# Patient Record
Sex: Female | Born: 1960 | Race: White | Hispanic: No | Marital: Married | Smoking: Never smoker
Health system: Southern US, Community
[De-identification: ages and names within clinical notes are randomized; demographics above are authoritative.]

## PROBLEM LIST (undated history)

## (undated) DIAGNOSIS — R079 Chest pain, unspecified: Secondary | ICD-10-CM

## (undated) DIAGNOSIS — F419 Anxiety disorder, unspecified: Secondary | ICD-10-CM

## (undated) DIAGNOSIS — M1990 Unspecified osteoarthritis, unspecified site: Secondary | ICD-10-CM

## (undated) DIAGNOSIS — K6289 Other specified diseases of anus and rectum: Secondary | ICD-10-CM

## (undated) DIAGNOSIS — R002 Palpitations: Secondary | ICD-10-CM

## (undated) DIAGNOSIS — T7840XA Allergy, unspecified, initial encounter: Secondary | ICD-10-CM

## (undated) DIAGNOSIS — D649 Anemia, unspecified: Secondary | ICD-10-CM

## (undated) DIAGNOSIS — K219 Gastro-esophageal reflux disease without esophagitis: Secondary | ICD-10-CM

## (undated) DIAGNOSIS — Z5189 Encounter for other specified aftercare: Secondary | ICD-10-CM

## (undated) HISTORY — DX: Encounter for other specified aftercare: Z51.89

## (undated) HISTORY — PX: TUBAL LIGATION: SHX77

## (undated) HISTORY — DX: Other specified diseases of anus and rectum: K62.89

## (undated) HISTORY — DX: Gastro-esophageal reflux disease without esophagitis: K21.9

## (undated) HISTORY — PX: TOTAL ABDOMINAL HYSTERECTOMY: SHX209

## (undated) HISTORY — DX: Anemia, unspecified: D64.9

## (undated) HISTORY — DX: Allergy, unspecified, initial encounter: T78.40XA

## (undated) HISTORY — DX: Unspecified osteoarthritis, unspecified site: M19.90

## (undated) HISTORY — DX: Chest pain, unspecified: R07.9

## (undated) HISTORY — DX: Anxiety disorder, unspecified: F41.9

## (undated) HISTORY — PX: LUMBAR DISC SURGERY: SHX700

## (undated) HISTORY — DX: Palpitations: R00.2

---

## 2001-07-10 ENCOUNTER — Other Ambulatory Visit: Admission: RE | Admit: 2001-07-10 | Discharge: 2001-07-10 | Payer: Self-pay | Admitting: *Deleted

## 2003-12-23 ENCOUNTER — Encounter: Payer: Self-pay | Admitting: Nurse Practitioner

## 2003-12-23 ENCOUNTER — Encounter: Payer: Self-pay | Admitting: Gastroenterology

## 2004-08-11 DIAGNOSIS — K625 Hemorrhage of anus and rectum: Secondary | ICD-10-CM | POA: Insufficient documentation

## 2005-07-13 ENCOUNTER — Other Ambulatory Visit: Admission: RE | Admit: 2005-07-13 | Discharge: 2005-07-13 | Payer: Self-pay | Admitting: Obstetrics and Gynecology

## 2005-11-28 ENCOUNTER — Emergency Department (HOSPITAL_COMMUNITY): Admission: EM | Admit: 2005-11-28 | Discharge: 2005-11-28 | Payer: Self-pay | Admitting: Emergency Medicine

## 2007-01-20 ENCOUNTER — Other Ambulatory Visit: Admission: RE | Admit: 2007-01-20 | Discharge: 2007-01-20 | Payer: Self-pay | Admitting: Obstetrics and Gynecology

## 2007-02-13 ENCOUNTER — Ambulatory Visit: Payer: Self-pay | Admitting: Gastroenterology

## 2007-03-22 ENCOUNTER — Ambulatory Visit: Payer: Self-pay | Admitting: Gastroenterology

## 2007-09-13 ENCOUNTER — Ambulatory Visit: Payer: Self-pay | Admitting: Gastroenterology

## 2007-09-13 LAB — CONVERTED CEMR LAB
ALT: 15 units/L (ref 0–35)
AST: 42 units/L — ABNORMAL HIGH (ref 0–37)
Albumin: 3.5 g/dL (ref 3.5–5.2)
Alkaline Phosphatase: 53 units/L (ref 39–117)
BUN: 8 mg/dL (ref 6–23)
Basophils Absolute: 0.1 10*3/uL (ref 0.0–0.1)
Basophils Relative: 0.8 % (ref 0.0–1.0)
Bilirubin Urine: NEGATIVE
CO2: 30 meq/L (ref 19–32)
Calcium: 9.1 mg/dL (ref 8.4–10.5)
Chloride: 98 meq/L (ref 96–112)
Creatinine, Ser: 0.6 mg/dL (ref 0.4–1.2)
Crystals: NEGATIVE
Eosinophils Absolute: 0.1 10*3/uL (ref 0.0–0.6)
Eosinophils Relative: 0.9 % (ref 0.0–5.0)
GFR calc Af Amer: 137 mL/min
GFR calc non Af Amer: 113 mL/min
Glucose, Bld: 146 mg/dL — ABNORMAL HIGH (ref 70–99)
HCT: 35 % — ABNORMAL LOW (ref 36.0–46.0)
Hemoglobin: 11.6 g/dL — ABNORMAL LOW (ref 12.0–15.0)
Ketones, ur: NEGATIVE mg/dL
Lymphocytes Relative: 21 % (ref 12.0–46.0)
MCHC: 33.1 g/dL (ref 30.0–36.0)
MCV: 88.1 fL (ref 78.0–100.0)
Monocytes Absolute: 0.4 10*3/uL (ref 0.2–0.7)
Monocytes Relative: 5.3 % (ref 3.0–11.0)
Mucus, UA: NEGATIVE
Neutro Abs: 6 10*3/uL (ref 1.4–7.7)
Neutrophils Relative %: 72 % (ref 43.0–77.0)
Nitrite: NEGATIVE
Platelets: 239 10*3/uL (ref 150–400)
Potassium: 3.7 meq/L (ref 3.5–5.1)
RBC: 3.98 M/uL (ref 3.87–5.11)
RDW: 13.2 % (ref 11.5–14.6)
Sodium: 135 meq/L (ref 135–145)
Specific Gravity, Urine: 1.025 (ref 1.000–1.03)
Total Bilirubin: 0.4 mg/dL (ref 0.3–1.2)
Total Protein, Urine: NEGATIVE mg/dL
Total Protein: 6.4 g/dL (ref 6.0–8.3)
Urine Glucose: NEGATIVE mg/dL
Urobilinogen, UA: 0.2 (ref 0.0–1.0)
WBC: 8.3 10*3/uL (ref 4.5–10.5)
pH: 6 (ref 5.0–8.0)

## 2007-09-22 ENCOUNTER — Ambulatory Visit: Payer: Self-pay | Admitting: Gastroenterology

## 2007-09-22 LAB — CONVERTED CEMR LAB
Ferritin: 5.3 ng/mL — ABNORMAL LOW (ref 10.0–291.0)
Folate: 7.1 ng/mL
Iron: 85 ug/dL (ref 42–145)
Saturation Ratios: 18.4 % — ABNORMAL LOW (ref 20.0–50.0)
Transferrin: 330.8 mg/dL (ref >=212.0)
Vitamin B-12: 290 pg/mL (ref 211–911)

## 2008-02-23 DIAGNOSIS — K5289 Other specified noninfective gastroenteritis and colitis: Secondary | ICD-10-CM | POA: Insufficient documentation

## 2008-02-23 DIAGNOSIS — K5120 Ulcerative (chronic) proctitis without complications: Secondary | ICD-10-CM

## 2008-02-23 DIAGNOSIS — R197 Diarrhea, unspecified: Secondary | ICD-10-CM

## 2008-02-23 DIAGNOSIS — K589 Irritable bowel syndrome without diarrhea: Secondary | ICD-10-CM | POA: Insufficient documentation

## 2009-06-13 ENCOUNTER — Ambulatory Visit (HOSPITAL_COMMUNITY): Admission: RE | Admit: 2009-06-13 | Discharge: 2009-06-14 | Payer: Self-pay | Admitting: Neurological Surgery

## 2009-07-21 ENCOUNTER — Encounter: Admission: RE | Admit: 2009-07-21 | Discharge: 2009-07-21 | Payer: Self-pay | Admitting: Neurological Surgery

## 2009-09-15 ENCOUNTER — Encounter: Admission: RE | Admit: 2009-09-15 | Discharge: 2009-09-15 | Payer: Self-pay | Admitting: Neurological Surgery

## 2010-03-06 ENCOUNTER — Encounter: Payer: Self-pay | Admitting: Gastroenterology

## 2010-03-06 ENCOUNTER — Telehealth (INDEPENDENT_AMBULATORY_CARE_PROVIDER_SITE_OTHER): Payer: Self-pay

## 2010-03-12 ENCOUNTER — Encounter: Payer: Self-pay | Admitting: Nurse Practitioner

## 2010-03-12 ENCOUNTER — Other Ambulatory Visit: Admission: RE | Admit: 2010-03-12 | Discharge: 2010-03-12 | Payer: Self-pay | Admitting: Family Medicine

## 2010-04-10 ENCOUNTER — Encounter (INDEPENDENT_AMBULATORY_CARE_PROVIDER_SITE_OTHER): Payer: Self-pay | Admitting: *Deleted

## 2010-04-15 ENCOUNTER — Ambulatory Visit: Payer: Self-pay | Admitting: Internal Medicine

## 2010-04-15 ENCOUNTER — Telehealth: Payer: Self-pay | Admitting: Gastroenterology

## 2010-04-15 ENCOUNTER — Ambulatory Visit: Payer: Self-pay | Admitting: Gastroenterology

## 2010-04-19 ENCOUNTER — Telehealth: Payer: Self-pay | Admitting: Gastroenterology

## 2010-07-22 ENCOUNTER — Emergency Department (HOSPITAL_COMMUNITY)
Admission: EM | Admit: 2010-07-22 | Discharge: 2010-07-23 | Payer: Self-pay | Source: Home / Self Care | Admitting: Emergency Medicine

## 2010-09-14 ENCOUNTER — Encounter: Admission: RE | Admit: 2010-09-14 | Discharge: 2010-09-14 | Payer: Self-pay | Admitting: Neurological Surgery

## 2010-09-28 ENCOUNTER — Encounter
Admission: RE | Admit: 2010-09-28 | Discharge: 2010-09-28 | Payer: Self-pay | Source: Home / Self Care | Attending: Neurological Surgery | Admitting: Neurological Surgery

## 2010-10-23 ENCOUNTER — Ambulatory Visit (HOSPITAL_COMMUNITY)
Admission: RE | Admit: 2010-10-23 | Discharge: 2010-10-24 | Payer: Self-pay | Source: Home / Self Care | Attending: Neurological Surgery | Admitting: Neurological Surgery

## 2010-10-26 LAB — SURGICAL PCR SCREEN
MRSA, PCR: NEGATIVE
Staphylococcus aureus: POSITIVE — AB

## 2010-10-26 LAB — DIFFERENTIAL
Basophils Absolute: 0 10*3/uL (ref 0.0–0.1)
Basophils Relative: 1 % (ref 0–1)
Eosinophils Absolute: 0.1 10*3/uL (ref 0.0–0.7)
Eosinophils Relative: 1 % (ref 0–5)
Lymphocytes Relative: 29 % (ref 12–46)
Lymphs Abs: 1.9 10*3/uL (ref 0.7–4.0)
Monocytes Absolute: 0.4 10*3/uL (ref 0.1–1.0)
Monocytes Relative: 6 % (ref 3–12)
Neutro Abs: 4.4 10*3/uL (ref 1.7–7.7)
Neutrophils Relative %: 64 % (ref 43–77)

## 2010-10-26 LAB — BASIC METABOLIC PANEL
BUN: 9 mg/dL (ref 6–23)
CO2: 26 mEq/L (ref 19–32)
Calcium: 9.4 mg/dL (ref 8.4–10.5)
Chloride: 104 mEq/L (ref 96–112)
Creatinine, Ser: 0.67 mg/dL (ref 0.4–1.2)
GFR calc Af Amer: >60 mL/min (ref >60)
GFR calc non Af Amer: >60 mL/min (ref >60)
Glucose, Bld: 90 mg/dL (ref 70–99)
Potassium: 4.8 mEq/L (ref 3.5–5.1)
Sodium: 137 mEq/L (ref 135–145)

## 2010-10-26 LAB — PROTIME-INR
INR: 1.01 (ref 0.00–1.49)
Prothrombin Time: 13.5 seconds (ref 11.6–15.2)

## 2010-10-26 LAB — CBC
HCT: 38.2 % (ref 36.0–46.0)
Hemoglobin: 12.7 g/dL (ref 12.0–15.0)
MCH: 29.9 pg (ref 26.0–34.0)
MCHC: 33.2 g/dL (ref 30.0–36.0)
MCV: 89.9 fL (ref 78.0–100.0)
Platelets: 217 10*3/uL (ref 150–400)
RBC: 4.25 MIL/uL (ref 3.87–5.11)
RDW: 14.2 % (ref 11.5–15.5)
WBC: 6.8 10*3/uL (ref 4.0–10.5)

## 2010-10-26 LAB — APTT: aPTT: 31 seconds (ref 24–37)

## 2010-11-06 NOTE — Op Note (Addendum)
NAMENGOC, DETJEN NO.:  [id number]  MEDICAL RECORD NO.:  [id number]          PATIENT TYPE:  OIB  LOCATION:  9417                         FACILITY:  [facility]  PHYSICIAN:  Eustace Moore, MD     DATE OF BIRTH:  March 24, 1961  DATE OF PROCEDURE:  10/23/2010 DATE OF DISCHARGE:                              OPERATIVE REPORT   PREOPERATIVE DIAGNOSIS:  Adjacent level cervical spondylosis, C3-4, C4-5 with neck and right arm pain.  POSTOPERATIVE DIAGNOSIS:  Adjacent level cervical spondylosis, C3-4, C4- 5 with neck and right arm pain.  PROCEDURES: 1. Cervical reexploration with removal of hardware, C5-C7. 2. Decompressive anterior cervical diskectomy, C3-4, C4-5. 3. Anterior cervical arthrodesis, C3-4, C4-5 utilizing PEEK interbody     cages packed with local autograft and Actifuse putty. 4. Anterior cervical plating, C3-C5, utilizing the Orthofix plate.  SURGEON:  Eustace Moore, MD  ASSISTANT:  Kary Kos, MD  ANESTHESIA:  General endotracheal.  COMPLICATIONS:  None apparent.  INDICATIONS FOR PROCEDURE:  Ms. Detlefsen is a 50 year old female who underwent a 2-level ACDF with plating at C5-6 and C6-7 in the past, who presented with neck and right arm pain.  She had a CT scan which showed a solid fusion at the previous levels, both spondylosis at C3-4 and C4-5 with severe neural foraminal stenosis on the right at C4-5, recommended cervical reexploration with removal of the plate and anterior cervical diskectomy and fusion plating at C3-4 and C4-5 in hopes of improving her pain syndrome.  She understood the risks, benefits, and expected outcome, and wished to proceed.  DESCRIPTION OF THE PROCEDURE:  The patient was taken to the operating room and after induction of adequate generalized endotracheal anesthesia, she was placed in supine position on the operating room table.  Her right anterior cervical region was prepped with DuraPrep and then draped in usual sterile  fashion.  A 3 mL of local anesthesia was injected and a transverse incision was made to the right of the midline and carried down to the platysma which was elevated and then undermined with Metzenbaum scissors.  I then dissected a plane medial to the sternocleidomastoid mastoid muscle and the internal carotid artery and lateral to the trachea and esophagus to expose the old plate.  I used blunt dissection to expose the plate.  I was able to remove the locking caps and then removed the screws and then removed the plate and inspected the previous fusion.  I then took down the longus colli muscles at C3-4 and C4-5.  Intraoperative fluoroscopy confirmed my level.  The annulus was incised and the initial diskectomies were done with pituitary rongeurs.  The shadow-line retractors were placed under the longus colli muscles to expose C3-4 and C4-5.  I then removed the anterior-inferior lip of C3 and C4 to further uncover the disk space.  I then used curettes to scrape the endplates to free it of disk.  I then used the high-speed drill to prepare the endplates for arthrodesis.  The drill shavings were saved in a mucous trap for later arthrodesis.  I drilled down to the level of  the posterior spurs and posterior longitudinal ligament.  The operating microscope was brought to the field.  The posterior longitudinal was opened with a black nerve hook and then removed, undercutting the bodies of C4 and C5 and C3 and C4. There were large osteophytes on the right-hand side at both levels that were removed with 1 and 2 mm Kerrison punch until the dura was free and our nerve hook passed easily circumferentially and into the foramina. The nerve hook passed easily over the C4 and C5 nerve roots.  We could see the cord pulsatile through the dura.  We felt like we had a good decompression of both levels, then we measured our interspace to be 7 mm at C4-5 and 6 mm at C3-4 and used corresponding PEEK interbody  cages packed with local autograft and Actifuse putty and tapped these into position at both levels.  I then used a 40-mm Orthofix reliant plate and placed two 14-mm variable angle screws in the bodies of C3, C4, and C5, and then locked these into position with the locking mechanism.  I then irrigated with saline solution containing bacitracin, dried all bleeding points with bipolar cautery and with Surgifoam, placed a 7 flat JP drain and once meticulous hemostasis was achieved, I closed the platysma with 3-0 Vicryl, closed the subcuticular tissue with 3-0 Vicryl, and closed the skin with Benzoin and Steri-Strips.  The drapes were removed.  The sterile dressing was applied.  The patient was awakened from general anesthesia and transferred to recovery room in stable condition.  At the end of the procedure, all sponge, needles, and instrument counts were correct.     Eustace Moore, MD     DSJ/MEDQ  D:  10/23/2010  T:  10/23/2010  Job:  [id number]  Electronically Signed by Sherley Bounds MD on 11/06/2010 08:41:10 AM

## 2010-11-10 NOTE — Progress Notes (Signed)
Summary: Triage   Phone Note Call from Patient Call back at Home Phone [phone]   Caller: Patient Call For: Dr. [name] Plan Reason for Call: Talk to Nurse Summary of Call: pt. having problems with her colitis and wants to know if something can be prescribed. Initial call taken by: Webb Laws,  April 15, 2010 11:20 AM  Follow-up for Phone Call        Patient  is scheduled for colon with Dr [name] Plan for next week.  She hasn't been on Lialda or Canasa for years.  patient repoorts a flare of her UC. She will come in today and see Tye Savoy RNP at 2:00 to be assessed.   Follow-up by: Barb Merino RN, [place],  April 15, 2010 12:04 PM

## 2010-11-10 NOTE — Letter (Signed)
Summary: Previsit letter  [facility]  [place], [place] [address]   Phone: [phone]  Fax: [phone]       03/06/2010 MRN: [id number]  [facility] [place], [place]  [address]  Dear [name],  Welcome to the Gastroenterology Division at [facility].    You are scheduled to see a nurse for your pre-procedure visit on 04/15/10  at 10:30 a.m. on the 3rd floor at [facility] [place] [employer].  We ask that you try to arrive at our office 15 minutes prior to your appointment time to allow for check-in.  Your nurse visit will consist of discussing your medical and surgical history, your immediate family medical history, and your medications.    Please bring a complete list of all your medications or, if you prefer, bring the medication bottles and we will list them.  We will need to be aware of both prescribed and over the counter drugs.  We will need to know exact dosage information as well.  If you are on blood thinners (Coumadin, Plavix, Aggrenox, Ticlid, etc.) please call our office today/prior to your appointment, as we need to consult with your physician about holding your medication.   Please be prepared to read and sign documents such as consent forms, a financial agreement, and acknowledgement forms.  If necessary, and with your consent, a friend or relative is welcome to sit-in on the nurse visit with you.  Please bring your insurance card so that we may make a copy of it.  If your insurance requires a referral to see a specialist, please bring your referral form from your primary care physician.  No co-pay is required for this nurse visit.     If you cannot keep your appointment, please call [phone] to cancel or reschedule prior to your appointment date.  This allows [place] the opportunity to schedule an appointment for another patient in need of care.    Thank you for choosing [facility] for your medical  needs.  We appreciate the opportunity to care for you.  Please visit [place] at our website  to learn more about our practice.                     Sincerely.                                                                                                                   The Gastroenterology Division  Appended Document: Previsit letter letter mailed to patient's home

## 2010-11-10 NOTE — Progress Notes (Signed)
Summary: Schedule overdue recall colon   Phone Note Outgoing Call Call back at [facility] Phone [phone]   Call placed by: Barb Merino RN, CGRN,  Mar 06, 2010 10:00 AM Call placed to: Patient Summary of Call: Placed a call to the patient to discuss scheduling overdue recall colon.  Patient  is scheduled for 04/23/10 8:30 and a pre-visit 04/15/10 10:30  Initial call taken by: Barb Merino RN, CGRN,  Mar 06, 2010 10:01 AM

## 2010-11-10 NOTE — Progress Notes (Signed)
   Phone Note Call from Patient   Caller: Patient Summary of Call: On call note at 1100. Still has rectal bleeding, unchanged. Seen in office on Wednesday with presumed proctitis flare. Restarted Azulfidine but did not start Canasa due to cost of $500. Advised to continue Azulifidine and if bleeding does not improve will try Canasa Supp samples and/or HC enemas. Initial call taken by: Ladene Artist MD Marval Regal,  April 19, 2010 12:17 PM

## 2010-11-10 NOTE — Letter (Signed)
Summary: [facility] Instructions  [facility]  [place], [place] [address]   Phone: [phone]  Fax: [phone]       ROAN SAWCHUK    07-20-1961    MRN: [id number]        Procedure Day Sudie Grumbling:  thursday 04/23/2010     Arrival Time: 7:30 am      Procedure Time: 8:30 am     Location of Procedure:                    _x _  [place] (4th Floor)                        [place]   Starting 5 days prior to your procedure Saturday 7/9 do not eat nuts, seeds, popcorn, corn, beans, peas,  salads, or any raw vegetables.  Do not take any fiber supplements (e.g. Metamucil, Citrucel, and Benefiber).  THE DAY BEFORE YOUR PROCEDURE         DATE: Wednesday 7/13  1.  Drink clear liquids the entire day-NO SOLID FOOD  2.  Do not drink anything colored red or purple.  Avoid juices with pulp.  No orange juice.  3.  Drink at least 64 oz. (8 glasses) of fluid/clear liquids during the day to prevent dehydration and help the prep work efficiently.  CLEAR LIQUIDS INCLUDE: Water Jello Ice Popsicles Tea (sugar ok, no milk/cream) Powdered fruit flavored drinks Coffee (sugar ok, no milk/cream) Gatorade Juice: apple, white grape, white cranberry  Lemonade Clear bullion, consomm, broth Carbonated beverages (any kind) Strained chicken noodle soup Hard Candy                             4.  In the morning, mix first dose of MoviPrep solution:    Empty 1 Pouch A and 1 Pouch B into the disposable container    Add lukewarm drinking water to the top line of the container. Mix to dissolve    Refrigerate (mixed solution should be used within 24 hrs)  5.  Begin drinking the prep at 5:00 p.m. The MoviPrep container is divided by 4 marks.   Every 15 minutes drink the solution down to the next mark (approximately 8 oz) until the full liter is complete.   6.  Follow completed prep with 16 oz of clear liquid of your choice (Nothing  red or purple).  Continue to drink clear liquids until bedtime.  7.  Before going to bed, mix second dose of MoviPrep solution:    Empty 1 Pouch A and 1 Pouch B into the disposable container    Add lukewarm drinking water to the top line of the container. Mix to dissolve    Refrigerate  THE DAY OF YOUR PROCEDURE      DATE: Thursday 7/14  Beginning at 3:30 am (5 hours before procedure):         1. Every 15 minutes, drink the solution down to the next mark (approx 8 oz) until the full liter is complete.  2. Follow completed prep with 16 oz. of clear liquid of your choice.    3. You may drink clear liquids until 6:30 am (2 HOURS BEFORE PROCEDURE).   MEDICATION INSTRUCTIONS  Unless otherwise instructed, you should take regular prescription medications with a small sip of water   as early as possible the morning of  your procedure.        OTHER INSTRUCTIONS  You will need a responsible adult at least 52 years of age to accompany you and drive you home.   This person must remain in the waiting room during your procedure.  Wear loose fitting clothing that is easily removed.  Leave jewelry and other valuables at home.  However, you may wish to bring a book to read or  an iPod/MP3 player to listen to music as you wait for your procedure to start.  Remove all body piercing jewelry and leave at home.  Total time from sign-in until discharge is approximately 2-3 hours.  You should go home directly after your procedure and rest.  You can resume normal activities the  day after your procedure.  The day of your procedure you should not:   Drive   Make legal decisions   Operate machinery   Drink alcohol   Return to work  You will receive specific instructions about eating, activities and medications before you leave.    The above instructions have been reviewed and explained to me by   Ulice Dash RN  April 15, 2010 11:09 AM     I fully understand and can verbalize  these instructions _____________________________ Date _________

## 2010-11-10 NOTE — Assessment & Plan Note (Signed)
Summary: pt of stark/ colitis flare/Jacqueline Castro    History of Present Illness Visit Type: Follow-up Visit Primary GI MD: [name] MD Jacqueline Castro Primary Provider: [name], MD  Requesting Provider: na Chief Complaint: Colitis flare. 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  Patient discontinued IBD meds in 2008 because she felt well. She did fine except for occasional diarrhea, rectal bleeding. Several days ago developed increasing gas, RLQ pain, urgency, and rectal bleeding. Not having a lot of diarrhea, in fact stools vary from "pellets" to loose and doesn't necessarily have BM every day. She is scheduled for a colonoscopy with Dr. [name] Plan next Thursday but pretty miserable so came in today. No fevers. Joints in her hand ache. Weight stable.   GI Review of Systems     Location of  Abdominal pain: RLQ.    Denies abdominal pain, acid reflux, belching, bloating, chest pain, dysphagia with liquids, dysphagia with solids, heartburn, loss of appetite, nausea, vomiting, vomiting blood, weight loss, and  weight gain.      Reports rectal bleeding.     Denies anal fissure, black tarry stools, change in bowel habit, constipation, diarrhea, diverticulosis, fecal incontinence, heme positive stool, hemorrhoids, irritable bowel syndrome, jaundice, light color stool, liver problems, and  rectal pain.    Current Medications (verified): 1)  Cymbalta 30 Mg Cpep (Duloxetine Hcl) .... One Tablet By Mouth Once Daily 2)  Vitamin C 500 Mg  Tabs (Ascorbic Acid) .... One Tablet By Mouth Once Daily 3)  Vitamin D 1000 Unit  Tabs (Cholecalciferol) .... One Tablet By Mouth Once Daily 4)  Fish Oil   Oil (Fish Oil) .... One Tablet By Mouth Once Daily  Allergies (verified): 1)  ! Codeine  Past History:  Past Medical History: ? 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  Alcohol Use - no Daily Caffeine Use: Coffee  Illicit Drug Use - no Smoking Status:  never Drug Use:  no  Review of Systems  The patient denies allergy/sinus, anemia, anxiety-new, arthritis/joint pain, back pain, blood in urine, breast changes/lumps, change in vision, confusion, cough, coughing up blood, depression-new, fainting, fatigue, fever, headaches-new, hearing problems, heart murmur, heart rhythm changes, itching, menstrual pain, muscle pains/cramps, night sweats, nosebleeds, pregnancy symptoms, shortness of breath, skin rash, sleeping problems, sore throat, swelling of feet/legs, swollen lymph glands, thirst - excessive , urination - excessive , urination changes/pain, urine leakage, vision changes, and voice change.    Vital Signs:  Patient profile:   50 year old female Height:      65 inches Weight:      151 pounds BMI:     25.22 BSA:     1.76 Pulse rate:   88 / minute Pulse rhythm:   regular BP sitting:   100 / 60  (left arm) Cuff size:   regular  Vitals Entered By: Hope Pigeon CMA (April 15, 2010 2:04 PM)  Physical Exam  General:  Well developed, well nourished, no acute distress. Head:  Normocephalic and atraumatic. Eyes:  Conjunctiva pink, no icterus.  Mouth:  No oral lesions. Tongue moist.  Neck:  no obvious masses  Lungs:  Clear throughout to auscultation. Heart:  Regular rate and rhythm; no murmurs, rubs,  or bruits. Abdomen:  Abdomen soft,  nondistended. Mild LLQ tenderness. No obvious masses or hepatomegaly.Normal bowel sounds.  Rectal:  No fissures or external or internal lesions appreciated. Scant amount light brown heme negative stool in vault Msk:  Symmetrical with no gross deformities.  Normal posture. Extremities:  No palmar erythema, no edema.  Neurologic:  Alert and  oriented x4;  grossly normal neurologically. Skin:  Intact without significant lesions or rashes. Cervical Nodes:  No significant cervical adenopathy. Psych:  Alert and cooperative. Normal mood and affect.   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 Restart prior medications including Sulfasalazine 1 gm two times a day and Canasa Suppositories. She understands these medications will not work right away. Will obtain the results of labs drawn last month by PCP. Today will obtain ESR.  Low residue diet for now.  Keep scheduled colonoscopy appointment.   Other Orders: TLB-Sedimentation Rate (ESR) (85652-ESR)  Patient Instructions: 1)  Please go to lab, basement level. 2)  Low residue diet brochure provided. 3)  We have sent prescriptions to [facility] for Canasa Suppositories and Sufasalazine.  4)  Copy sent to : [name] , MD 5)  The medication list was reviewed and reconciled.  All changed / newly prescribed medications were explained.  A complete medication list was provided to the patient / caregiver. Prescriptions: CANASA 1000 MG SUPP (MESALAMINE) Use 1 suppository at bedtime  #30 x 6   Entered by:   Marisue Humble NCMA   Authorized by:   Tye Savoy NP   Signed by:   Marisue Humble NCMA on 04/15/2010   Method used:   Electronically to        [id number]* (retail)       [place], [place]  [address]       Ph: [phone]       Fax: [phone]   RxID:   [id number] SULFASALAZINE 500 MG TABS (SULFASALAZINE) Take 2 tab twice daily  #120 x 6   Entered by:   Marisue Humble NCMA   Authorized by:   Tye Savoy NP   Signed by:   Marisue Humble NCMA on 04/15/2010   Method used:   Electronically to        [facility] [id number]* (retail)       [address]       [place],   [address]       Ph: [phone]       Fax: [phone]   RxID:   [id number]

## 2010-11-10 NOTE — Miscellaneous (Signed)
Summary: [facility]  Clinical Lists Changes  Medications: Added new medication of MOVIPREP 100 GM  SOLR (PEG-KCL-NACL-NASULF-NA ASC-C) As per prep instructions. - Signed Rx of MOVIPREP 100 GM  SOLR (PEG-KCL-NACL-NASULF-NA ASC-C) As per prep instructions.;  #1 x 0;  Signed;  Entered by: Ulice Dash RN;  Authorized by: Ladene Artist MD [facility];  Method used: Electronically to [facility] [id number] [address] [place], [place]  [address], Ph: [phone], Fax: [phone] Allergies: Added new allergy or adverse reaction of CODEINE Observations: Added new observation of NKA: F (04/15/2010 10:13)    Prescriptions: MOVIPREP 100 GM  SOLR (PEG-KCL-NACL-NASULF-NA ASC-C) As per prep instructions.  #1 x 0   Entered by:   Ulice Dash RN   Authorized by:   Ladene Artist MD [facility]   Signed by:   Ulice Dash RN on 04/15/2010   Method used:   Electronically to        [facility] [address]* (retail)       [place], [place]  [address]       Ph: [phone]       Fax: [phone]   RxID:   [id number]

## 2010-11-17 ENCOUNTER — Other Ambulatory Visit: Payer: Self-pay | Admitting: Neurological Surgery

## 2010-11-17 ENCOUNTER — Ambulatory Visit
Admission: RE | Admit: 2010-11-17 | Discharge: 2010-11-17 | Disposition: A | Discharge status: Home / Self Care | Payer: Managed Care, Other (non HMO) | Source: Ambulatory Visit | Attending: Neurological Surgery | Admitting: Neurological Surgery

## 2010-11-17 DIAGNOSIS — M47812 Spondylosis without myelopathy or radiculopathy, cervical region: Secondary | ICD-10-CM

## 2010-11-17 DIAGNOSIS — M542 Cervicalgia: Secondary | ICD-10-CM

## 2010-11-17 DIAGNOSIS — M79609 Pain in unspecified limb: Secondary | ICD-10-CM

## 2011-01-16 LAB — PROTIME-INR
INR: 1.1 (ref 0.00–1.49)
Prothrombin Time: 13.7 seconds (ref 11.6–15.2)

## 2011-01-16 LAB — BASIC METABOLIC PANEL
BUN: 13 mg/dL (ref 6–23)
CO2: 28 mEq/L (ref 19–32)
Calcium: 9.1 mg/dL (ref 8.4–10.5)
Chloride: 105 mEq/L (ref 96–112)
Creatinine, Ser: 0.75 mg/dL (ref 0.4–1.2)
GFR calc Af Amer: >60 mL/min (ref >60)
GFR calc non Af Amer: >60 mL/min (ref >60)
Glucose, Bld: 86 mg/dL (ref 70–99)
Potassium: 4.2 mEq/L (ref 3.5–5.1)
Sodium: 139 mEq/L (ref 135–145)

## 2011-01-16 LAB — CBC
HCT: 37.3 % (ref 36.0–46.0)
Hemoglobin: 12.6 g/dL (ref 12.0–15.0)
MCHC: 33.7 g/dL (ref 30.0–36.0)
MCV: 93.3 fL (ref 78.0–100.0)
Platelets: 212 10*3/uL (ref 150–400)
RBC: 4 MIL/uL (ref 3.87–5.11)
RDW: 14.1 % (ref 11.5–15.5)
WBC: 7 10*3/uL (ref 4.0–10.5)

## 2011-01-16 LAB — APTT: aPTT: 26 seconds (ref 24–37)

## 2011-01-16 LAB — DIFFERENTIAL
Basophils Absolute: 0 10*3/uL (ref 0.0–0.1)
Basophils Relative: 0 % (ref 0–1)
Eosinophils Absolute: 0.1 10*3/uL (ref 0.0–0.7)
Eosinophils Relative: 1 % (ref 0–5)
Lymphocytes Relative: 28 % (ref 12–46)
Lymphs Abs: 2 10*3/uL (ref 0.7–4.0)
Monocytes Absolute: 0.4 10*3/uL (ref 0.1–1.0)
Monocytes Relative: 5 % (ref 3–12)
Neutro Abs: 4.6 10*3/uL (ref 1.7–7.7)
Neutrophils Relative %: 65 % (ref 43–77)

## 2011-01-19 ENCOUNTER — Ambulatory Visit
Admission: RE | Admit: 2011-01-19 | Discharge: 2011-01-19 | Disposition: A | Discharge status: Home / Self Care | Payer: Managed Care, Other (non HMO) | Source: Ambulatory Visit | Attending: Neurological Surgery | Admitting: Neurological Surgery

## 2011-01-19 ENCOUNTER — Other Ambulatory Visit: Payer: Self-pay | Admitting: Neurological Surgery

## 2011-01-19 DIAGNOSIS — M79609 Pain in unspecified limb: Secondary | ICD-10-CM

## 2011-01-19 DIAGNOSIS — M47812 Spondylosis without myelopathy or radiculopathy, cervical region: Secondary | ICD-10-CM

## 2011-01-19 DIAGNOSIS — M542 Cervicalgia: Secondary | ICD-10-CM

## 2011-02-23 NOTE — Assessment & Plan Note (Signed)
[facility] OFFICE NOTE   MATEJA, DIER                        MRN:          [id number]  DATE:03/22/2007                            DOB:          [birth year]    This is a return office visit for ulcerative proctitis.  Her symptoms  have completely resolved after a short course of Canasa suppositories  and Lialda.  She completed suppositories and remains on Lialda.  She has  no gastrointestinal complaints and specifically denies any rectal  bleeding, diarrhea, mucus per rectum or abdominal pain.   CURRENT MEDICATIONS:  1. Cymbalta 60 mg daily.  2. Lialda 2.4 g daily.  3. Ibuprofen p.r.n.   MEDICATION ALLERGIES:  CODEINE.   EXAM:  No acute distress, weight 149 pounds, blood pressure is 94/68,  pulse 84 and regular.  She was not re-examined.   ASSESSMENT AND PLAN:  Ulcerative proctitis.  She requests generic  medication for cost reasons.  Trial of sulfasalazine 1 g b.i.d. and if  she tolerates this, she can remain on this long-term for maintenance  therapy.  I would like to see her for return office visit at least every  year and sooner, if she has recurrent symptoms.  If she does not  tolerate the sulfasalazine, she is to notify our office and we will try  another 5ASA agent.     Pricilla Riffle. [name] Plan, MD, [facility]  Electronically Signed    MTS/MedQ  DD: 03/22/2007  DT: 03/22/2007  Job #: [id number]

## 2011-02-26 NOTE — Assessment & Plan Note (Signed)
[facility]                         GASTROENTEROLOGY OFFICE NOTE   Jacqueline Castro                        MRN:          [id number]  DATE:02/13/2007                            DOB:          1960/11/24    CHIEF COMPLAINT:  A 50 year old, white female who returns for followup  of rectal bleeding and ulcerative proctitis.   HISTORY OF PRESENT ILLNESS:  Jacqueline Castro was diagnosed with colitis at  age 12. I first saw her in November 2005 for rectal bleeding associated  with alternating diarrhea and constipation. She underwent a colonoscopy  in March 2005 which showed proctitis. She was also felt to have  irritable bowel syndrome. She was treated with Canasa suppositories and  Pamine Forte and her symptoms resolved. She has not returned for  followup since April 2005. She states she has had episodic flares of  rectal bleeding associated with mucous and diarrhea. She no longer has  any constipation but she does have intermittent diarrhea including  occasional urgent stools. Over the past several months, her symptoms  have been quite active with daily rectal bleeding and mucous per rectum.  She notes no abdominal pain, nausea or vomiting. She has lost about 10  pounds intentionally on a weight loss program. Her mother has colon  polyps, no other family members with colon polyps, colon cancer or  inflammatory bowel disease.   PAST MEDICAL HISTORY:  Anxiety, status post bilateral tubal ligation,  psoriasis, ulcerative proctitis.   CURRENT MEDICATIONS:  1. Cymbalta 60 mg daily.  2. Ibuprofen p.r.n.   MEDICATION ALLERGIES:  CODEINE.   SOCIAL HISTORY:  As per the handwritten forms.   REVIEW OF SYSTEMS:  As per the handwritten forms.   PHYSICAL EXAMINATION:  GENERAL:  Well-developed, well-nourished, white  female in no acute distress.  VITAL SIGNS:  Height 5 feet 7 inches, weight 153 pounds. Blood pressure  is 96/64, pulse 72 and regular.  HEENT:   Anicteric sclera. Oropharynx clear.  CHEST:  Clear to auscultation bilaterally.  CARDIAC:  Regular rate and rhythm without murmurs appreciated.  ABDOMEN:  Soft, nontender, nondistended, normal active bowel sounds, no  palpable organomegaly, masses or hernias.  EXTREMITIES:  Without clubbing, cyanosis or edema.  NEUROLOGIC:  Alert and oriented x3. Grossly nonfocal.   ASSESSMENT/PLAN:  Recurrent ulcerative proctitis. I discussed the long-  term need for closer followup and possible need for long-term 5 ASA  usage. Begin Lialda 2.4 grams q.a.m. and begin Canasa 1000 mg  suppositories q.h.s.  Return office visit in 4-6 weeks. Plan for  colonoscopy in the near future to reassess her disease and perform  interval surveillance.     Jacqueline Castro Plan, MD, [facility]  Electronically Signed    MTS/MedQ  DD: 02/13/2007  DT: 02/13/2007  Job #: [id number]

## 2011-03-24 ENCOUNTER — Other Ambulatory Visit: Payer: Self-pay | Admitting: Dermatology

## 2011-04-16 ENCOUNTER — Ambulatory Visit (HOSPITAL_BASED_OUTPATIENT_CLINIC_OR_DEPARTMENT_OTHER)
Admission: RE | Admit: 2011-04-16 | Discharge: 2011-04-16 | Disposition: A | Discharge status: Home / Self Care | Payer: Managed Care, Other (non HMO) | Source: Ambulatory Visit | Attending: Orthopedic Surgery | Admitting: Orthopedic Surgery

## 2011-04-16 ENCOUNTER — Other Ambulatory Visit: Payer: Self-pay | Admitting: Orthopedic Surgery

## 2011-04-16 DIAGNOSIS — Z01812 Encounter for preprocedural laboratory examination: Secondary | ICD-10-CM | POA: Insufficient documentation

## 2011-04-16 DIAGNOSIS — L819 Disorder of pigmentation, unspecified: Secondary | ICD-10-CM | POA: Insufficient documentation

## 2011-04-18 LAB — WOUND CULTURE

## 2011-04-19 ENCOUNTER — Ambulatory Visit
Admission: RE | Admit: 2011-04-19 | Discharge: 2011-04-19 | Disposition: A | Discharge status: Home / Self Care | Payer: Managed Care, Other (non HMO) | Source: Ambulatory Visit | Attending: Neurological Surgery | Admitting: Neurological Surgery

## 2011-04-19 ENCOUNTER — Other Ambulatory Visit: Payer: Self-pay | Admitting: Neurological Surgery

## 2011-04-19 DIAGNOSIS — M542 Cervicalgia: Secondary | ICD-10-CM

## 2011-04-19 DIAGNOSIS — M47812 Spondylosis without myelopathy or radiculopathy, cervical region: Secondary | ICD-10-CM

## 2011-04-19 DIAGNOSIS — M79609 Pain in unspecified limb: Secondary | ICD-10-CM

## 2011-04-21 LAB — ANAEROBIC CULTURE: Gram Stain: NONE SEEN

## 2011-04-30 NOTE — Op Note (Signed)
  NAMEMORGEN, LINEBAUGH NO.:  [id number]  MEDICAL RECORD NO.:  [id number]  LOCATION:                                 FACILITY:  PHYSICIAN:  Daryll Brod, M.D.       DATE OF BIRTH:  [birth year]  DATE OF PROCEDURE:  04/16/2011 DATE OF DISCHARGE:                              OPERATIVE REPORT   PREOPERATIVE DIAGNOSIS:  Pigmented lesion, left ring finger nail bed.  POSTOPERATIVE DIAGNOSIS:  Pigmented lesion, left ring finger nail bed.  OPERATION:  Biopsy, left ring finger nail bed.  SURGEON:  Daryll Brod, MD  ASSISTANT:  None.  ANESTHESIA:  Forearm-based IV regional with local infiltration, metacarpal block.  ANESTHESIOLOGIST:  Jessy Oto. Frederick, MD  HISTORY:  The patient is a 50 year old female with a history of a pigmented stripe on her left ring finger nail bed.  This progressed all the way to the tip.  She has been advised to have this biopsied by her dermatologist.  Pre, peri, and postoperative course have been discussed along with risks and complications.  She is aware that there is no guarantee with the surgery, possibility of infection, recurrence of injury to arteries, nerves, and tendons, deformity to the nail plate, and regrowth.  In the preoperative area, the patient is seen.  The extremity was marked by both the patient and surgeon.  PROCEDURE:  The patient was brought to the operating room where a forearm-based IV regional anesthetic was carried out without difficulty. She was prepped using ChloraPrep, supine position, left arm free.  A 3- minute dry time was allowed.  A time-out was taken confirming the patient and procedure.  The nail plate was removed.  The pigmented area had been marked proximally and distally.  On removal of the nail plate, the pigmented area was noted to be at the proximal margin and multiple were noted.  The largest one with the greater stripe was then biopsied with an elliptical incision.  Cultures were taken prior to  the biopsy for aerobic, anaerobic, fungal, and AFB cultures.  A portion of the biopsy specimen was cut and sent also.  The nail plate was sent.  A portion was sent for pathological inspection under the microscope.  The wound was irrigated.  The nail matrix was then closed with interrupted 6- 0 chromic sutures.  A nonadherent gauze was placed between the dorsal palmar nail folds.  A sterile compressive dressing and splint were applied.  This was done after irrigation.  The patient tolerated the procedure well and was taken to the recovery room for observation in satisfactory condition.  She will be discharged home to return the [place] in 1 week on [place] NX.          ______________________________ Daryll Brod, M.D.     GK/MEDQ  D:  04/16/2011  T:  04/17/2011  Job:  [id number]  Electronically Signed by Daryll Brod M.D. on 04/30/2011 09:19:26 AM

## 2011-05-20 LAB — CULTURE, FUNGUS WITHOUT SMEAR

## 2011-08-06 ENCOUNTER — Telehealth: Payer: Self-pay | Admitting: Gastroenterology

## 2011-08-06 NOTE — Telephone Encounter (Signed)
Left message for patient to call back  

## 2011-08-09 MED ORDER — SULFASALAZINE 500 MG PO TABS: 500.0000 mg | ORAL_TABLET | Freq: Two times a day (BID) | ORAL | Status: DC

## 2011-08-09 NOTE — Telephone Encounter (Signed)
Can resume sulfasalazine 539m po bid until office appt with AE.  She has been not compliant with recommended medical follow up

## 2011-08-09 NOTE — Telephone Encounter (Signed)
Patient is having a large amount of bleeding and mucus daily she is passing blood and mucus several times a day, even with urination.  She does report one diarrhea stool a day.  Patient reports in the past she has taken sulfasalazine, for Ulcerative proctitis.  She did not keep follow up appts last year as suggested by Tye Savoy RNP on  04/15/2010, she also stopped sulfasalazine because her symptoms had improved.  She is scheduled to come in and see Nicoletta Ba PA on 08/11/11 when Dr [name] Plan is supervising I have canceled the appt with Dr [name] Plan for 08/16/11

## 2011-08-09 NOTE — Telephone Encounter (Signed)
Patient advised.

## 2011-08-11 ENCOUNTER — Ambulatory Visit (INDEPENDENT_AMBULATORY_CARE_PROVIDER_SITE_OTHER): Payer: Managed Care, Other (non HMO) | Admitting: Physician Assistant

## 2011-08-11 ENCOUNTER — Encounter: Payer: Self-pay | Admitting: Physician Assistant

## 2011-08-11 ENCOUNTER — Other Ambulatory Visit (INDEPENDENT_AMBULATORY_CARE_PROVIDER_SITE_OTHER): Payer: Managed Care, Other (non HMO)

## 2011-08-11 VITALS — BP 98/60 | HR 84 | Ht 65.5 in | Wt 147.8 lb

## 2011-08-11 DIAGNOSIS — K5120 Ulcerative (chronic) proctitis without complications: Secondary | ICD-10-CM

## 2011-08-11 DIAGNOSIS — K625 Hemorrhage of anus and rectum: Secondary | ICD-10-CM

## 2011-08-11 LAB — CBC WITH DIFFERENTIAL/PLATELET
Eosinophils Relative: 1.2 % (ref 0.0–5.0)
HCT: 35.8 % — ABNORMAL LOW (ref 36.0–46.0)
Hemoglobin: 12 g/dL (ref 12.0–15.0)
Lymphs Abs: 1.4 10*3/uL (ref 0.7–4.0)
MCV: 89.4 fl (ref 78.0–100.0)
Monocytes Absolute: 0.5 10*3/uL (ref 0.1–1.0)
Neutro Abs: 3.3 10*3/uL (ref 1.4–7.7)
Platelets: 235 10*3/uL (ref 150.0–400.0)
WBC: 5.3 10*3/uL (ref 4.5–10.5)

## 2011-08-11 MED ORDER — HYDROCORTISONE 100 MG/60ML RE ENEM: ENEMA | RECTAL | Status: DC

## 2011-08-11 MED ORDER — GLYCOPYRROLATE 2 MG PO TABS: ORAL_TABLET | ORAL | Status: DC

## 2011-08-11 MED ORDER — PEG-KCL-NACL-NASULF-NA ASC-C 100 G PO SOLR: ORAL | Status: DC

## 2011-08-11 NOTE — Progress Notes (Signed)
Reviewed and agree with management. Pricilla Riffle. [name] Plan MD Marval Regal

## 2011-08-11 NOTE — Progress Notes (Signed)
Subjective:    Patient ID: Jacqueline Castro, female    DOB: [birth year], 50 y.o.   MRN: [id number]  HPI [name] is a 50 year old white female known to Dr. [name] Plan with history of ulcerative proctitis. She has not been seen in the past couple of years and last had colonoscopy in 2005. At that time she was found to have probable proctitis. Biopsies did show chronic active proctitis. She has been treated with Azulfidine in the past but has not taken it chronically. She comes in today stating that she's been having a flareup of her symptoms over the past 3 months or so and has had intermittent mild flareups over the past couple of years but nothing to this extent. She relates lower abdominal discomfort and cramping as well as some urgency and frequency of stooling. She says she usually has about one bowel movement a day and over the past couple of months has been having a 2-3 bowel movements per day usually containing some blood and mucus. The stools are often urgent as well. Her appetite has been  fair ,she's been trying to lose some weigh,t has had some mild occasional nausea. She's not been on any recent antibiotics or any different medications that she has had an increase in her stress level recently. When she called she was started back on Azulfidine 500 mg twice daily which she's been taking over the past few days    Review of Systems  Constitutional: Negative.   HENT: Negative.   Eyes: Negative.   Respiratory: Negative.   Cardiovascular: Negative.   Gastrointestinal: Positive for abdominal pain and blood in stool.  Genitourinary: Negative.   Musculoskeletal: Negative.   Skin: Negative.   Neurological: Negative.   Hematological: Negative.   Psychiatric/Behavioral: The patient is nervous/anxious.    Outpatient Prescriptions Prior to Visit  Medication Sig Dispense Refill  . sulfaSALAzine (AZULFIDINE) 500 MG tablet Take 1 tablet (500 mg total) by mouth 2 (two) times daily.  60 tablet  0  . vitamin C  (ASCORBIC ACID) 500 MG tablet Take 500 mg by mouth daily.        . cholecalciferol (VITAMIN D) 1000 UNITS tablet Take 1,000 Units by mouth daily.        . DULoxetine (CYMBALTA) 30 MG capsule Take 30 mg by mouth daily.        . fish oil-omega-3 fatty acids 1000 MG capsule Take 2 g by mouth daily.        . mesalamine (CANASA) 1000 MG suppository Place 1,000 mg rectally at bedtime.             Allergies  Allergen Reactions  . Codeine     REACTION: vomiting   Patient Active Problem List  Diagnoses  . ULCERATIVE PROCTITIS  . IRRITABLE BOWEL SYNDROME  . RECTAL BLEEDING  . DIARRHEA, ACUTE, CHRONIC  . Ulcerative proctitis, chronic    Objective:   Physical Exam Well-developed white female in no acute distress, pleasant, blood pressure 98/60, pulse 84, HEENT; EOMI ,PERRLA s,clera anicteric,Neck; Supple no JVD, Cardiovascular; regular rate and rhythm with S1-S2 no murmur or gallop, Pulmonary; clear bilaterally, Abdomen; soft mildly tender  across the lower abdomen, no guarding, no rebound, no palpable masses or hepatosplenomegaly Rectal ;not done, Extremities; no clubbing, cyanosis or edema skin warm dry, Psych; mood and affect normal and appropriate.        Assessment & Plan:  #61  50 year old female with history of ulcerative proctitis with exacerbation x3 months presenting  with lower common cramping increased frequency of stooling urgency and rectal bleeding all consistent with acute flare of ulcerative proctitis/colitis.  Plan; increase Azulfidine to 500 mg 2 by mouth twice daily Add enemas twice daily x2-3 weeks Check CBC today Add Robinul Forte 2 mg by mouth once or twice daily as needed for cramping Schedule for colonoscopy with Dr. [name] Plan to reassess extent of her disease. Procedure discussed in detail with the patient and she is agreeable to proceed.

## 2011-08-11 NOTE — Patient Instructions (Addendum)
Please go to the basement level to have your labs drawn.  We sent a prescription for the colonoscopy prep to your pharmacy and have given  You a rebate coupon. [facility] [address]  Colonoscopy instructions given today. Scheduled with Dr. [name] Plan on 09-06-2011.

## 2011-08-12 ENCOUNTER — Telehealth: Payer: Self-pay | Admitting: *Deleted

## 2011-08-12 NOTE — Telephone Encounter (Signed)
Message copied by Hulan Saas on Thu Aug 12, 2011  9:14 AM ------      Message from: [place], [place] S      Created: Thu Aug 12, 2011  8:46 AM       Please let Jacqueline Castro know her blood counts look good-hgb 12- so she is not anemic

## 2011-08-12 NOTE — Telephone Encounter (Signed)
Spoke with patient and gave her the lab results as per Nicoletta Ba, PA

## 2011-08-12 NOTE — Telephone Encounter (Signed)
Left a message for patient to call me. 

## 2011-08-16 ENCOUNTER — Ambulatory Visit: Payer: Managed Care, Other (non HMO) | Admitting: Gastroenterology

## 2011-08-27 ENCOUNTER — Other Ambulatory Visit: Payer: Self-pay | Admitting: Gastroenterology

## 2011-09-06 ENCOUNTER — Other Ambulatory Visit: Payer: Managed Care, Other (non HMO) | Admitting: Gastroenterology

## 2011-09-14 ENCOUNTER — Ambulatory Visit (AMBULATORY_SURGERY_CENTER): Payer: Managed Care, Other (non HMO) | Admitting: Gastroenterology

## 2011-09-14 ENCOUNTER — Encounter: Payer: Self-pay | Admitting: Gastroenterology

## 2011-09-14 DIAGNOSIS — K5190 Ulcerative colitis, unspecified, without complications: Secondary | ICD-10-CM

## 2011-09-14 DIAGNOSIS — K625 Hemorrhage of anus and rectum: Secondary | ICD-10-CM

## 2011-09-14 DIAGNOSIS — K5120 Ulcerative (chronic) proctitis without complications: Secondary | ICD-10-CM

## 2011-09-14 MED ORDER — MESALAMINE 1000 MG RE SUPP: 1000.0000 mg | Freq: Two times a day (BID) | RECTAL | Status: DC

## 2011-09-14 MED ORDER — SODIUM CHLORIDE 0.9 % IV SOLN: 500.0000 mL | INTRAVENOUS | Status: DC

## 2011-09-14 NOTE — Patient Instructions (Signed)
Please refer to your blue and neon green sheets for instructions regarding diet and activity for the rest of today.  You may resume your medications as you would normally take them.   Please call and schedule an appointment to see Dr. [name] Plan in 4-6 weeks.

## 2011-09-14 NOTE — Progress Notes (Signed)
Patient did not experience any of the following events: a burn prior to discharge; a fall within the facility; wrong site/side/patient/procedure/implant event; or a hospital transfer or hospital admission upon discharge from the facility. (G8907) Patient did not have preoperative order for IV antibiotic SSI prophylaxis. (G8918)  

## 2011-09-15 ENCOUNTER — Telehealth: Payer: Self-pay | Admitting: *Deleted

## 2011-09-15 NOTE — Telephone Encounter (Signed)

## 2011-09-20 ENCOUNTER — Encounter: Payer: Self-pay | Admitting: Gastroenterology

## 2011-10-03 ENCOUNTER — Other Ambulatory Visit: Payer: Self-pay | Admitting: Gastroenterology

## 2011-10-18 ENCOUNTER — Ambulatory Visit: Payer: Managed Care, Other (non HMO) | Admitting: Gastroenterology

## 2012-08-01 ENCOUNTER — Other Ambulatory Visit: Payer: Self-pay | Admitting: Orthopedic Surgery

## 2012-08-01 DIAGNOSIS — M25511 Pain in right shoulder: Secondary | ICD-10-CM

## 2012-08-02 ENCOUNTER — Ambulatory Visit
Admission: RE | Admit: 2012-08-02 | Discharge: 2012-08-02 | Disposition: A | Discharge status: Home / Self Care | Payer: Managed Care, Other (non HMO) | Source: Ambulatory Visit | Attending: Orthopedic Surgery | Admitting: Orthopedic Surgery

## 2012-08-02 ENCOUNTER — Other Ambulatory Visit: Payer: Managed Care, Other (non HMO)

## 2012-08-02 DIAGNOSIS — M25511 Pain in right shoulder: Secondary | ICD-10-CM

## 2014-04-10 ENCOUNTER — Ambulatory Visit (INDEPENDENT_AMBULATORY_CARE_PROVIDER_SITE_OTHER): Payer: Managed Care, Other (non HMO) | Admitting: Cardiovascular Disease

## 2014-04-10 ENCOUNTER — Encounter: Payer: Self-pay | Admitting: Cardiovascular Disease

## 2014-04-10 VITALS — BP 118/74 | HR 74 | Ht 65.0 in | Wt 137.9 lb

## 2014-04-10 DIAGNOSIS — R002 Palpitations: Secondary | ICD-10-CM

## 2014-04-10 DIAGNOSIS — R0602 Shortness of breath: Secondary | ICD-10-CM

## 2014-04-10 DIAGNOSIS — R079 Chest pain, unspecified: Secondary | ICD-10-CM

## 2014-04-10 NOTE — Assessment & Plan Note (Addendum)
53 year old female referred by Dr. [name] for recent onset tachypalpitations associated with chest fullness, shortness of breath and nausea. She has no cardiac risk factors. First episode occurred about a week and a half ago when visiting her mother. She developed sudden onset of tachycardia palpitations associated with shortness of breath, chest fullness and nausea. The episode lasted approximately 5 minutes. She was evaluated in a local emergency room and was discharged without a diagnosis. She had a recurrent episode approximately 2 days ago. She does exercise routinely on the elliptical and is weightbearing without chest pain or shortness of breath. Dr. Tamala Julian recently noted blood work which I will obtain copies of. Hopefully thyroid function tests were obtained. I am going to get a 2-D echocardiogram as well as a one-month event monitor and we'll see her back in followup

## 2014-04-10 NOTE — Progress Notes (Signed)
04/10/2014 Jacqueline Castro   1961/05/28  [id number]  Primary Physician Reginia Naas, MD Primary Cardiologist: Lorretta Harp MD Renae Gloss   HPI:  Ms. Hauter is a delightful 53 year old married Caucasian female mother of 4 children, grandmother and 2 grandchildren he works for [employer]. She was by Dr. [name] for cardiovascular evaluation because of recent onset tachypalpitations. Ms. Asmus has no cardiac risk factors. She does have anxiety and a history of ulcerative colitis. She had an episode of tachycardia palpitations associated with chest tightness, shortness of breath nausea 1-1/2 weeks ago while visiting her mother who is ill. 3 days ago she had a recurrent episode. Prior to this she said no similar episodes. She is very active and exercises routinely without symptoms.   Current Outpatient Prescriptions  Medication Sig Dispense Refill  . DULoxetine (CYMBALTA) 30 MG capsule Take 30 mg by mouth daily.       No current facility-administered medications for this visit.    Allergies  Allergen Reactions  . Codeine     REACTION: vomiting    History   Social History  . Marital Status: Married    Spouse Name: N/A    Number of Children: 4  . Years of Education: N/A   Occupational History  . [employer]   .  [employer]   Social History Main Topics  . Smoking status: Never Smoker   . Smokeless tobacco: Never Used  . Alcohol Use: No     Comment: occasionally  . Drug Use: No  . Sexual Activity: Not on file   Other Topics Concern  . Not on file   Social History Narrative  . No narrative on file     Review of Systems: General: negative for chills, fever, night sweats or weight changes.  Cardiovascular: negative for chest pain, dyspnea on exertion, edema, orthopnea, palpitations, paroxysmal nocturnal dyspnea or shortness of breath Dermatological: negative for rash Respiratory: negative for cough or wheezing Urologic: negative for  hematuria Abdominal: negative for nausea, vomiting, diarrhea, bright red blood per rectum, melena, or hematemesis Neurologic: negative for visual changes, syncope, or dizziness All other systems reviewed and are otherwise negative except as noted above.    Blood pressure 118/74, pulse 74, height 5' 5"  (1.651 m), weight 137 lb 14.4 oz (62.551 kg).  General appearance: alert and no distress Neck: no adenopathy, no carotid bruit, no JVD, supple, symmetrical, trachea midline and thyroid not enlarged, symmetric, no tenderness/mass/nodules Lungs: clear to auscultation bilaterally Heart: regular rate and rhythm, S1, S2 normal, no murmur, click, rub or gallop Extremities: extremities normal, atraumatic, no cyanosis or edema  EKG normal sinus rhythm at 74 without ST or T wave changes  ASSESSMENT AND PLAN:   Palpitations 53 year old female referred by Dr. [name] for recent onset tachypalpitations associated with chest fullness, shortness of breath and nausea. She has no cardiac risk factors. First episode occurred about a week and a half ago when visiting her mother. She developed sudden onset of tachycardia palpitations associated with shortness of breath, chest fullness and nausea. The episode lasted approximately 5 minutes. She was evaluated in a local emergency room and was discharged without a diagnosis. She had a recurrent episode approximately 2 days ago. She does exercise routinely on the elliptical and is weightbearing without chest pain or shortness of breath. Dr. Tamala Julian recently noted blood work which I will obtain copies of. Hopefully thyroid function tests were obtained. I am going to get a 2-D echocardiogram as  well as a one-month event monitor and we'll see her back in followup      Lorretta Harp MD [facility], [facility] 04/10/2014 1:53 PM

## 2014-04-10 NOTE — Patient Instructions (Signed)
  We will see you back in follow up after the tests.   Dr Gwenlyn Found has ordered : 1.  Echocardiogram. Echocardiography is a painless test that uses sound waves to create images of your heart. It provides your doctor with information about the size and shape of your heart and how well your heart's chambers and valves are working. This procedure takes approximately one hour. There are no restrictions for this procedure.   2.  Event monitor. Event monitors are medical devices that record the heart's electrical activity. Doctors most often [place] these monitors to diagnose arrhythmias. Arrhythmias are problems with the speed or rhythm of the heartbeat. The monitor is a small, portable device. You can wear one while you do your normal daily activities. This is usually used to diagnose what is causing palpitations/syncope (passing out).

## 2014-04-17 ENCOUNTER — Ambulatory Visit (HOSPITAL_COMMUNITY)
Admission: RE | Admit: 2014-04-17 | Discharge: 2014-04-17 | Disposition: A | Discharge status: Home / Self Care | Payer: Managed Care, Other (non HMO) | Source: Ambulatory Visit | Attending: Cardiology | Admitting: Cardiology

## 2014-04-17 DIAGNOSIS — I059 Rheumatic mitral valve disease, unspecified: Secondary | ICD-10-CM

## 2014-04-17 DIAGNOSIS — R0602 Shortness of breath: Secondary | ICD-10-CM | POA: Insufficient documentation

## 2014-04-17 DIAGNOSIS — R002 Palpitations: Secondary | ICD-10-CM | POA: Insufficient documentation

## 2014-04-17 NOTE — Progress Notes (Signed)
2D Echo Performed 04/17/2014    Marygrace Drought, RCS

## 2014-04-19 ENCOUNTER — Telehealth: Payer: Self-pay | Admitting: Cardiovascular Disease

## 2014-04-19 NOTE — Telephone Encounter (Signed)
Pt said she had an echo on Wednesday,she wants to know if the results are ready?

## 2014-04-19 NOTE — Telephone Encounter (Signed)
Notified patient that once the results are reviewed by Dr.Berry he will give recommendations to his nurse and she will either call her or send her  A letter. Patient voiced understanding.

## 2014-05-07 ENCOUNTER — Ambulatory Visit: Payer: Managed Care, Other (non HMO) | Admitting: Cardiovascular Disease

## 2014-05-20 ENCOUNTER — Ambulatory Visit (INDEPENDENT_AMBULATORY_CARE_PROVIDER_SITE_OTHER): Payer: Managed Care, Other (non HMO) | Admitting: Cardiovascular Disease

## 2014-05-20 ENCOUNTER — Encounter: Payer: Self-pay | Admitting: Cardiovascular Disease

## 2014-05-20 VITALS — BP 136/85 | HR 65 | Ht 65.0 in | Wt 143.0 lb

## 2014-05-20 DIAGNOSIS — R002 Palpitations: Secondary | ICD-10-CM

## 2014-05-20 NOTE — Assessment & Plan Note (Signed)
The patient has had several episodes of palpitations since I saw her 6 months ago. A 2-D echocardiogram revealed normal LV systolic function. The monitor showed sinus rhythm with PACs and sinus tachycardia. She does admit to being under a lot of stress. I have suggested that she cut out caffeine intake. She'll see a mid-level provider back in 3 months and me back in 6 months.

## 2014-05-20 NOTE — Progress Notes (Signed)
     05/20/2014 Jacqueline Castro   December 06, 1960  [id number]  Primary Physician Reginia Naas, MD Primary Cardiologist: Lorretta Harp MD Renae Gloss   HPI:  Jacqueline Castro is a delightful 53 year old married Caucasian female mother of 4 children, grandmother and 2 grandchildren he works for [employer]. She was by Dr. [name] for cardiovascular evaluation because of recent onset tachypalpitations. Jacqueline Castro has no cardiac risk factors. She does have anxiety and a history of ulcerative colitis. She had an episode of tachycardia palpitations associated with chest tightness, shortness of breath nausea 1-1/2 weeks ago while visiting her mother who is ill. 3 days ago she had a recurrent episode. Prior to this she said no similar episodes. She is very active and exercises routinely without symptoms. I saw her in the office 6 weeks ago. I performed 2-D echocardiography revealing normal LV systolic function as well as an event monitor that showed sinus rhythm with PACs and sinus tachycardia.    Current Outpatient Prescriptions  Medication Sig Dispense Refill  . ALPRAZolam (XANAX) 0.5 MG tablet Take 0.5 mg by mouth as needed.       . DULoxetine (CYMBALTA) 30 MG capsule Take 30 mg by mouth daily.       No current facility-administered medications for this visit.    Allergies  Allergen Reactions  . Codeine     REACTION: vomiting    History   Social History  . Marital Status: Married    Spouse Name: N/A    Number of Children: 4  . Years of Education: N/A   Occupational History  . [employer]   .  [employer]   Social History Main Topics  . Smoking status: Never Smoker   . Smokeless tobacco: Never Used  . Alcohol Use: No     Comment: occasionally  . Drug Use: No  . Sexual Activity: Not on file   Other Topics Concern  . Not on file   Social History Narrative  . No narrative on file     Review of Systems: General: negative for chills, fever, night  sweats or weight changes.  Cardiovascular: negative for chest pain, dyspnea on exertion, edema, orthopnea, palpitations, paroxysmal nocturnal dyspnea or shortness of breath Dermatological: negative for rash Respiratory: negative for cough or wheezing Urologic: negative for hematuria Abdominal: negative for nausea, vomiting, diarrhea, bright red blood per rectum, melena, or hematemesis Neurologic: negative for visual changes, syncope, or dizziness All other systems reviewed and are otherwise negative except as noted above.    Blood pressure 136/85, pulse 65, height 5' 5"  (1.651 m), weight 143 lb (64.864 kg).  General appearance: alert and no distress Neck: no adenopathy, no carotid bruit, no JVD, supple, symmetrical, trachea midline and thyroid not enlarged, symmetric, no tenderness/mass/nodules Lungs: clear to auscultation bilaterally Heart: regular rate and rhythm, S1, S2 normal, no murmur, click, rub or gallop Extremities: extremities normal, atraumatic, no cyanosis or edema  EKG not performed today  ASSESSMENT AND PLAN:   Palpitations The patient has had several episodes of palpitations since I saw her 6 months ago. A 2-D echocardiogram revealed normal LV systolic function. The monitor showed sinus rhythm with PACs and sinus tachycardia. She does admit to being under a lot of stress. I have suggested that she cut out caffeine intake. She'll see a mid-level provider back in 3 months and me back in 6 months.      Lorretta Harp MD FACP,FACC,FAHA, [facility] 05/20/2014 9:42 AM

## 2014-05-20 NOTE — Patient Instructions (Signed)
We request that you follow-up in: 3 months with an extender and in 6 months with Dr Andria Rhein will receive a reminder letter in the mail two months in advance. If you don't receive a letter, please call our office to schedule the follow-up appointment.  Your physician recommends that you return for a FASTING lipid profile

## 2014-09-02 ENCOUNTER — Other Ambulatory Visit: Payer: Self-pay | Admitting: Family Medicine

## 2014-09-02 ENCOUNTER — Other Ambulatory Visit (HOSPITAL_COMMUNITY)
Admission: RE | Admit: 2014-09-02 | Discharge: 2014-09-02 | Disposition: A | Discharge status: Home / Self Care | Payer: Managed Care, Other (non HMO) | Source: Ambulatory Visit | Attending: Family Medicine | Admitting: Family Medicine

## 2014-09-02 DIAGNOSIS — Z01419 Encounter for gynecological examination (general) (routine) without abnormal findings: Secondary | ICD-10-CM | POA: Insufficient documentation

## 2014-09-04 LAB — CYTOLOGY - PAP

## 2014-12-23 ENCOUNTER — Other Ambulatory Visit: Payer: Self-pay | Admitting: Orthopedic Surgery

## 2014-12-23 DIAGNOSIS — M25512 Pain in left shoulder: Secondary | ICD-10-CM

## 2015-01-11 ENCOUNTER — Other Ambulatory Visit: Payer: Managed Care, Other (non HMO)

## 2015-02-13 ENCOUNTER — Encounter: Payer: Self-pay | Admitting: Gastroenterology

## 2016-04-19 ENCOUNTER — Emergency Department (HOSPITAL_COMMUNITY): Payer: Managed Care, Other (non HMO)

## 2016-04-19 ENCOUNTER — Emergency Department (HOSPITAL_COMMUNITY)
Admission: EM | Admit: 2016-04-19 | Discharge: 2016-04-19 | Disposition: A | Discharge status: Home / Self Care | Payer: Managed Care, Other (non HMO) | Attending: Emergency Medicine | Admitting: Emergency Medicine

## 2016-04-19 ENCOUNTER — Other Ambulatory Visit: Payer: Self-pay

## 2016-04-19 ENCOUNTER — Encounter (HOSPITAL_COMMUNITY): Payer: Self-pay | Admitting: *Deleted

## 2016-04-19 DIAGNOSIS — K219 Gastro-esophageal reflux disease without esophagitis: Secondary | ICD-10-CM | POA: Insufficient documentation

## 2016-04-19 DIAGNOSIS — Z79899 Other long term (current) drug therapy: Secondary | ICD-10-CM | POA: Insufficient documentation

## 2016-04-19 DIAGNOSIS — R079 Chest pain, unspecified: Secondary | ICD-10-CM | POA: Diagnosis present

## 2016-04-19 DIAGNOSIS — R0789 Other chest pain: Secondary | ICD-10-CM

## 2016-04-19 LAB — CBC
HCT: 39.7 % (ref 36.0–46.0)
HEMOGLOBIN: 13.2 g/dL (ref 12.0–15.0)
MCH: 31.2 pg (ref 26.0–34.0)
MCHC: 33.2 g/dL (ref 30.0–36.0)
MCV: 93.9 fL (ref 78.0–100.0)
PLATELETS: 208 10*3/uL (ref 150–400)
RBC: 4.23 MIL/uL (ref 3.87–5.11)
RDW: 13.1 % (ref 11.5–15.5)
WBC: 5.5 10*3/uL (ref 4.0–10.5)

## 2016-04-19 LAB — I-STAT TROPONIN, ED: TROPONIN I, POC: 0 ng/mL (ref 0.00–0.08)

## 2016-04-19 LAB — BASIC METABOLIC PANEL
Anion gap: 7 (ref 5–15)
BUN: 13 mg/dL (ref 6–20)
CALCIUM: 9 mg/dL (ref 8.9–10.3)
CO2: 23 mmol/L (ref 22–32)
CREATININE: 0.6 mg/dL (ref 0.44–1.00)
Chloride: 106 mmol/L (ref 101–111)
GFR calc Af Amer: >60 mL/min (ref >60)
GFR calc non Af Amer: >60 mL/min (ref >60)
GLUCOSE: 101 mg/dL — AB (ref 65–99)
Potassium: 3.8 mmol/L (ref 3.5–5.1)
Sodium: 136 mmol/L (ref 135–145)

## 2016-04-19 MED ORDER — PANTOPRAZOLE SODIUM 20 MG PO TBEC: 20.0000 mg | DELAYED_RELEASE_TABLET | Freq: Every day | ORAL | Status: DC

## 2016-04-19 MED ORDER — PANTOPRAZOLE SODIUM 40 MG PO TBEC
40.0000 mg | DELAYED_RELEASE_TABLET | Freq: Once | ORAL | Status: AC
  Administered 2016-04-19: 40 mg via ORAL
  Filled 2016-04-19: qty 1

## 2016-04-19 MED ORDER — GI COCKTAIL ~~LOC~~
30.0000 mL | Freq: Once | ORAL | Status: AC
  Administered 2016-04-19: 30 mL via ORAL
  Filled 2016-04-19: qty 30

## 2016-04-19 NOTE — Discharge Instructions (Signed)
Nonspecific Chest Pain  Chest pain can be caused by many different conditions. There is always a chance that your pain could be related to something serious, such as a heart attack or a blood clot in your lungs. Chest pain can also be caused by conditions that are not life-threatening. If you have chest pain, it is very important to follow up with your health care provider. CAUSES  Chest pain can be caused by:  Heartburn.  Pneumonia or bronchitis.  Anxiety or stress.  Inflammation around your heart (pericarditis) or lung (pleuritis or pleurisy).  A blood clot in your lung.  A collapsed lung (pneumothorax). It can develop suddenly on its own (spontaneous pneumothorax) or from trauma to the chest.  Shingles infection (varicella-zoster virus).  Heart attack.  Damage to the bones, muscles, and cartilage that make up your chest wall. This can include:  Bruised bones due to injury.  Strained muscles or cartilage due to frequent or repeated coughing or overwork.  Fracture to one or more ribs.  Sore cartilage due to inflammation (costochondritis). RISK FACTORS  Risk factors for chest pain may include:  Activities that increase your risk for trauma or injury to your chest.  Respiratory infections or conditions that cause frequent coughing.  Medical conditions or overeating that can cause heartburn.  Heart disease or family history of heart disease.  Conditions or health behaviors that increase your risk of developing a blood clot.  Having had chicken pox (varicella zoster). SIGNS AND SYMPTOMS Chest pain can feel like:  Burning or tingling on the surface of your chest or deep in your chest.  Crushing, pressure, aching, or squeezing pain.  Dull or sharp pain that is worse when you move, cough, or take a deep breath.  Pain that is also felt in your back, neck, shoulder, or arm, or pain that spreads to any of these areas. Your chest pain may come and go, or it may stay  constant. DIAGNOSIS Lab tests or other studies may be needed to find the cause of your pain. Your health care provider may have you take a test called an ambulatory ECG (electrocardiogram). An ECG records your heartbeat patterns at the time the test is performed. You may also have other tests, such as:  Transthoracic echocardiogram (TTE). During echocardiography, sound waves are used to create a picture of all of the heart structures and to look at how blood flows through your heart.  Transesophageal echocardiogram (TEE).This is a more advanced imaging test that obtains images from inside your body. It allows your health care provider to see your heart in finer detail.  Cardiac monitoring. This allows your health care provider to monitor your heart rate and rhythm in real time.  Holter monitor. This is a portable device that records your heartbeat and can help to diagnose abnormal heartbeats. It allows your health care provider to track your heart activity for several days, if needed.  Stress tests. These can be done through exercise or by taking medicine that makes your heart beat more quickly.  Blood tests.  Imaging tests. TREATMENT  Your treatment depends on what is causing your chest pain. Treatment may include:  Medicines. These may include:  Acid blockers for heartburn.  Anti-inflammatory medicine.  Pain medicine for inflammatory conditions.  Antibiotic medicine, if an infection is present.  Medicines to dissolve blood clots.  Medicines to treat coronary artery disease.  Supportive care for conditions that do not require medicines. This may include:  Resting.  Applying heat  or cold packs to injured areas.  Limiting activities until pain decreases. HOME CARE INSTRUCTIONS  If you were prescribed an antibiotic medicine, finish it all even if you start to feel better.  Avoid any activities that bring on chest pain.  Do not use any tobacco products, including  cigarettes, chewing tobacco, or electronic cigarettes. If you need help quitting, ask your health care provider.  Do not drink alcohol.  Take medicines only as directed by your health care provider.  Keep all follow-up visits as directed by your health care provider. This is important. This includes any further testing if your chest pain does not go away.  If heartburn is the cause for your chest pain, you may be told to keep your head raised (elevated) while sleeping. This reduces the chance that acid will go from your stomach into your esophagus.  Make lifestyle changes as directed by your health care provider. These may include:  Getting regular exercise. Ask your health care provider to suggest some activities that are safe for you.  Eating a heart-healthy diet. A registered dietitian can help you to learn healthy eating options.  Maintaining a healthy weight.  Managing diabetes, if necessary.  Reducing stress. SEEK MEDICAL CARE IF:  Your chest pain does not go away after treatment.  You have a rash with blisters on your chest.  You have a fever. SEEK IMMEDIATE MEDICAL CARE IF:   Your chest pain is worse.  You have an increasing cough, or you cough up blood.  You have severe abdominal pain.  You have severe weakness.  You faint.  You have chills.  You have sudden, unexplained chest discomfort.  You have sudden, unexplained discomfort in your arms, back, neck, or jaw.  You have shortness of breath at any time.  You suddenly start to sweat, or your skin gets clammy.  You feel nauseous or you vomit.  You suddenly feel light-headed or dizzy.  Your heart begins to beat quickly, or it feels like it is skipping beats. These symptoms may represent a serious problem that is an emergency. Do not wait to see if the symptoms will go away. Get medical help right away. Call your local emergency services (911 in the [place] Do not drive yourself to the hospital.   This  information is not intended to replace advice given to you by your health care provider. Make sure you discuss any questions you have with your health care provider.   Document Released: 07/07/2005 Document Revised: 10/18/2014 Document Reviewed: 05/03/2014 Elsevier Interactive Patient Education 2016 [employer] Suspected Gastroesophageal Reflux Disease, Adult Normally, food travels down the esophagus and stays in the stomach to be digested. However, when a person has gastroesophageal reflux disease (GERD), food and stomach acid move back up into the esophagus. When this happens, the esophagus becomes sore and inflamed. Over time, GERD can create small holes (ulcers) in the lining of the esophagus.  CAUSES This condition is caused by a problem with the muscle between the esophagus and the stomach (lower esophageal sphincter, or LES). Normally, the LES muscle closes after food passes through the esophagus to the stomach. When the LES is weakened or abnormal, it does not close properly, and that allows food and stomach acid to go back up into the esophagus. The LES can be weakened by certain dietary substances, medicines, and medical conditions, including:  Tobacco use.  Pregnancy.  Having a hiatal hernia.  Heavy alcohol use.  Certain foods and beverages, such as coffee,  chocolate, onions, and peppermint. RISK FACTORS This condition is more likely to develop in:  People who have an increased body weight.  People who have connective tissue disorders.  People who use NSAID medicines. SYMPTOMS Symptoms of this condition include:  Heartburn.  Difficult or painful swallowing.  The feeling of having a lump in the throat.  Abitter taste in the mouth.  Bad breath.  Having a large amount of saliva.  Having an upset or bloated stomach.  Belching.  Chest pain.  Shortness of breath or wheezing.  Ongoing (chronic) cough or a night-time cough.  Wearing away of tooth  enamel.  Weight loss. Different conditions can cause chest pain. Make sure to see your health care provider if you experience chest pain. DIAGNOSIS Your health care provider will take a medical history and perform a physical exam. To determine if you have mild or severe GERD, your health care provider may also monitor how you respond to treatment. You may also have other tests, including:  An endoscopy toexamine your stomach and esophagus with a small camera.  A test thatmeasures the acidity level in your esophagus.  A test thatmeasures how much pressure is on your esophagus.  A barium swallow or modified barium swallow to show the shape, size, and functioning of your esophagus. TREATMENT The goal of treatment is to help relieve your symptoms and to prevent complications. Treatment for this condition may vary depending on how severe your symptoms are. Your health care provider may recommend:  Changes to your diet.  Medicine.  Surgery. HOME CARE INSTRUCTIONS Diet  Follow a diet as recommended by your health care provider. This may involve avoiding foods and drinks such as:  Coffee and tea (with or without caffeine).  Drinks that containalcohol.  Energy drinks and sports drinks.  Carbonated drinks or sodas.  Chocolate and cocoa.  Peppermint and mint flavorings.  Garlic and onions.  Horseradish.  Spicy and acidic foods, including peppers, chili powder, curry powder, vinegar, hot sauces, and barbecue sauce.  Citrus fruit juices and citrus fruits, such as oranges, lemons, and limes.  Tomato-based foods, such as red sauce, chili, salsa, and pizza with red sauce.  Fried and fatty foods, such as donuts, french fries, potato chips, and high-fat dressings.  High-fat meats, such as hot dogs and fatty cuts of red and white meats, such as rib eye steak, sausage, ham, and bacon.  High-fat dairy items, such as whole milk, butter, and cream cheese.  Eat small, frequent  meals instead of large meals.  Avoid drinking large amounts of liquid with your meals.  Avoid eating meals during the 2-3 hours before bedtime.  Avoid lying down right after you eat.  Do not exercise right after you eat. General Instructions  Pay attention to any changes in your symptoms.  Take over-the-counter and prescription medicines only as told by your health care provider. Do not take aspirin, ibuprofen, or other NSAIDs unless your health care provider told you to do so.  Do not use any tobacco products, including cigarettes, chewing tobacco, and e-cigarettes. If you need help quitting, ask your health care provider.  Wear loose-fitting clothing. Do not wear anything tight around your waist that causes pressure on your abdomen.  Raise (elevate) the head of your bed 6 inches (15cm).  Try to reduce your stress, such as with yoga or meditation. If you need help reducing stress, ask your health care provider.  If you are overweight, reduce your weight to an amount that is  healthy for you. Ask your health care provider for guidance about a safe weight loss goal.  Keep all follow-up visits as told by your health care provider. This is important. SEEK MEDICAL CARE IF:  You have new symptoms.  You have unexplained weight loss.  You have difficulty swallowing, or it hurts to swallow.  You have wheezing or a persistent cough.  Your symptoms do not improve with treatment.  You have a hoarse voice. SEEK IMMEDIATE MEDICAL CARE IF:  You have pain in your arms, neck, jaw, teeth, or back.  You feel sweaty, dizzy, or light-headed.  You have chest pain or shortness of breath.  You vomit and your vomit looks like blood or coffee grounds.  You faint.  Your stool is bloody or black.  You cannot swallow, drink, or eat.   This information is not intended to replace advice given to you by your health care provider. Make sure you discuss any questions you have with your health  care provider.   Document Released: 07/07/2005 Document Revised: 06/18/2015 Document Reviewed: 01/22/2015 Elsevier Interactive Patient Education [employer].

## 2016-04-19 NOTE — ED Notes (Signed)
Patient presents stating for the past 2 weeks she has been waking up with chest tightness and feeling nauseated.

## 2016-04-19 NOTE — ED Provider Notes (Signed)
CSN: [id number]     Arrival date & time 04/19/16  0228 History   By signing my name below, I, Jacqueline Castro, attest that this documentation has been prepared under the direction and in the presence of No att. providers found . Electronically Signed: Dyke Castro, Scribe. 04/20/2016. 3:45 AM.   Chief Complaint  Patient presents with  . Chest Pain   The history is provided by the patient. No language interpreter was used.    HPI Comments:  Jacqueline Castro is a 55 y.o. female with PMHx of anxiety and palpitations who presents to the Emergency Department complaining of recurrent epigastrium tightness x2 weeks. Per pt, tonight the tightness has radiated to throat. She also complains of associated nausea and lightheadedness. Pt states she wakes up in the middle of the night and experiences these symptoms. She reports symptoms worsen when she gets out of bed and resolve within 5-10 minutes after she wakes up. No alleviating factors noted. She states walks 3 miles a day regularly and does not experience symptoms during exertion. Per pt, she last exercised 3 days ago and had no symptoms at that time. No prior history of acid reflux. Per pt, she had problems with heart palpitations in the past, but has had normal EKG and tests.She denies any swelling in legs.   Past Medical History  Diagnosis Date  . Ulcerative colitis   . Anemia   . Anxiety   . Proctitis   . Palpitations   . Chest pain    Past Surgical History  Procedure Laterality Date  . Tubal ligation    . Lumbar disc surgery      Neck   Family History  Problem Relation Age of Onset  . Colon cancer Paternal Grandfather 22  . Colon cancer Paternal Uncle 53  . Diabetes Mother   . Heart Problems Sister     bicuspid aortic valve   Social History  Substance Use Topics  . Smoking status: Never Smoker   . Smokeless tobacco: Never Used  . Alcohol Use: No     Comment: occasionally   OB History    No data available     Review of  Systems 10 Systems reviewed and are negative for acute change except as noted in the HPI.  Allergies  Codeine  Home Medications   Prior to Admission medications   Medication Sig Start Date End Date Taking? Authorizing Provider  ALPRAZolam Duanne Moron) 0.5 MG tablet Take 0.5 mg by mouth as needed.  03/26/14   Historical Provider, MD  DULoxetine (CYMBALTA) 30 MG capsule Take 30 mg by mouth daily.    Historical Provider, MD  pantoprazole (PROTONIX) 20 MG tablet Take 1 tablet (20 mg total) by mouth daily. 04/19/16   Jacqueline Shanks, MD   BP 132/86 mmHg  Pulse 65  Temp(Src) 97.9 F (36.6 C) (Oral)  Resp 15  Ht 5' 4"  (1.626 m)  Wt 140 lb (63.504 kg)  BMI 24.02 kg/m2  SpO2 96%  LMP 08/29/2011 Physical Exam  Constitutional: She is oriented to person, place, and time. She appears well-developed and well-nourished. No distress.  HENT:  Head: Normocephalic and atraumatic.  Eyes: Conjunctivae are normal.  Cardiovascular: Normal rate, regular rhythm and normal heart sounds.  Exam reveals no gallop and no friction rub.   No murmur heard. Normal peripheral pulses   Pulmonary/Chest: Effort normal and breath sounds normal. She has no wheezes. She has no rales.  Abdominal: She exhibits no distension.  Musculoskeletal: She exhibits no  tenderness.  No peripheral edema, Calves are soft, non tender   Neurological: She is alert and oriented to person, place, and time.  Skin: Skin is warm and dry.  Psychiatric: She has a normal mood and affect.  Nursing note and vitals reviewed.   ED Course  Procedures  DIAGNOSTIC STUDIES:  Oxygen Saturation is 100% on RA, normal by my interpretation.    COORDINATION OF CARE:  3:15 AM Will order GI cocktail and Protonix. Discussed treatment plan with pt at bedside and pt agreed to plan.  Labs Review Labs Reviewed  BASIC METABOLIC PANEL - Abnormal; Notable for the following:    Glucose, Bld 101 (*)    All other components within normal limits  CBC  I-STAT  TROPOININ, ED    Imaging Review Dg Chest 2 View  04/19/2016  CLINICAL DATA:  Mid chest pain tonight. EXAM: CHEST  2 VIEW COMPARISON:  10/20/2010 FINDINGS: Normal heart size and pulmonary vascularity. No focal airspace disease or consolidation in the lungs. No blunting of costophrenic angles. No pneumothorax. Mediastinal contours appear intact. Postoperative changes in the cervical spine. Degenerative changes in the spine. IMPRESSION: No active cardiopulmonary disease. Electronically Signed   By: Lucienne Capers M.D.   On: 04/19/2016 03:03   I have personally reviewed and evaluated these images and lab results as part of my medical decision-making.   EKG Interpretation   Date/Time:  Monday April 19 2016 03:31:21 EDT Ventricular Rate:  74 PR Interval:  142 QRS Duration: 97 QT Interval:  421 QTC Calculation: 468 R Axis:   70 Text Interpretation:  Sinus rhythm Confirmed by Wilson Singer  MD, STEPHEN ([id number])  on 04/20/2016 9:20:35 AM      MDM   Final diagnoses:  Other chest pain  Gastroesophageal reflux disease, esophagitis presence not specified   Suspect GERD. Patient symptoms only occur at night waking her from sleep. Patient experiences epigastric discomfort with associated throat sensation. No daytime symptoms even with exercise which she does regularly, walking up to 3 miles with ischemic symptoms. No significant cardiac risk factors. Patient felt all symptoms resolved after GI medications administered. She did not want to wait for 3 hour troponin. Patient d/c with Protonix and follow up instructions.     Jacqueline Shanks, MD 04/20/16 [phone]

## 2016-08-13 ENCOUNTER — Encounter: Payer: Self-pay | Admitting: Gastroenterology

## 2017-07-30 ENCOUNTER — Emergency Department (HOSPITAL_COMMUNITY): Admission: EM | Admit: 2017-07-30 | Discharge: 2017-07-30 | Discharge status: Against Medical Advice | Payer: 59

## 2017-08-10 DIAGNOSIS — K529 Noninfective gastroenteritis and colitis, unspecified: Secondary | ICD-10-CM | POA: Insufficient documentation

## 2017-08-10 DIAGNOSIS — H9012 Conductive hearing loss, unilateral, left ear, with unrestricted hearing on the contralateral side: Secondary | ICD-10-CM | POA: Insufficient documentation

## 2017-08-10 DIAGNOSIS — F419 Anxiety disorder, unspecified: Secondary | ICD-10-CM | POA: Insufficient documentation

## 2018-05-12 ENCOUNTER — Inpatient Hospital Stay (HOSPITAL_COMMUNITY)
Admission: EM | Admit: 2018-05-12 | Discharge: 2018-05-16 | DRG: 736 | Disposition: A | Discharge status: Home / Self Care | Payer: 59 | Attending: Internal Medicine | Admitting: Internal Medicine

## 2018-05-12 ENCOUNTER — Encounter (HOSPITAL_COMMUNITY): Payer: Self-pay | Admitting: General Practice

## 2018-05-12 ENCOUNTER — Emergency Department (HOSPITAL_COMMUNITY): Payer: 59

## 2018-05-12 ENCOUNTER — Telehealth: Payer: Self-pay | Admitting: Oncology

## 2018-05-12 ENCOUNTER — Other Ambulatory Visit: Payer: Self-pay

## 2018-05-12 DIAGNOSIS — R1900 Intra-abdominal and pelvic swelling, mass and lump, unspecified site: Secondary | ICD-10-CM | POA: Diagnosis not present

## 2018-05-12 DIAGNOSIS — Z452 Encounter for adjustment and management of vascular access device: Secondary | ICD-10-CM

## 2018-05-12 DIAGNOSIS — D62 Acute posthemorrhagic anemia: Secondary | ICD-10-CM | POA: Diagnosis not present

## 2018-05-12 DIAGNOSIS — Z79899 Other long term (current) drug therapy: Secondary | ICD-10-CM

## 2018-05-12 DIAGNOSIS — Z885 Allergy status to narcotic agent status: Secondary | ICD-10-CM

## 2018-05-12 DIAGNOSIS — I952 Hypotension due to drugs: Secondary | ICD-10-CM | POA: Diagnosis not present

## 2018-05-12 DIAGNOSIS — R402250 Coma scale, best verbal response, oriented, unspecified time: Secondary | ICD-10-CM | POA: Diagnosis present

## 2018-05-12 DIAGNOSIS — J9600 Acute respiratory failure, unspecified whether with hypoxia or hypercapnia: Secondary | ICD-10-CM

## 2018-05-12 DIAGNOSIS — N839 Noninflammatory disorder of ovary, fallopian tube and broad ligament, unspecified: Secondary | ICD-10-CM | POA: Diagnosis not present

## 2018-05-12 DIAGNOSIS — N179 Acute kidney failure, unspecified: Secondary | ICD-10-CM | POA: Diagnosis not present

## 2018-05-12 DIAGNOSIS — N838 Other noninflammatory disorders of ovary, fallopian tube and broad ligament: Secondary | ICD-10-CM | POA: Diagnosis present

## 2018-05-12 DIAGNOSIS — C562 Malignant neoplasm of left ovary: Secondary | ICD-10-CM | POA: Diagnosis not present

## 2018-05-12 DIAGNOSIS — R58 Hemorrhage, not elsewhere classified: Secondary | ICD-10-CM | POA: Diagnosis present

## 2018-05-12 DIAGNOSIS — J9811 Atelectasis: Secondary | ICD-10-CM | POA: Diagnosis not present

## 2018-05-12 DIAGNOSIS — R571 Hypovolemic shock: Secondary | ICD-10-CM | POA: Diagnosis present

## 2018-05-12 DIAGNOSIS — Z978 Presence of other specified devices: Secondary | ICD-10-CM

## 2018-05-12 DIAGNOSIS — R1032 Left lower quadrant pain: Secondary | ICD-10-CM | POA: Diagnosis not present

## 2018-05-12 DIAGNOSIS — R402360 Coma scale, best motor response, obeys commands, unspecified time: Secondary | ICD-10-CM | POA: Diagnosis present

## 2018-05-12 DIAGNOSIS — R402130 Coma scale, eyes open, to sound, unspecified time: Secondary | ICD-10-CM | POA: Diagnosis present

## 2018-05-12 DIAGNOSIS — R112 Nausea with vomiting, unspecified: Secondary | ICD-10-CM

## 2018-05-12 DIAGNOSIS — R180 Malignant ascites: Secondary | ICD-10-CM | POA: Diagnosis present

## 2018-05-12 DIAGNOSIS — T41295A Adverse effect of other general anesthetics, initial encounter: Secondary | ICD-10-CM | POA: Diagnosis not present

## 2018-05-12 DIAGNOSIS — K661 Hemoperitoneum: Secondary | ICD-10-CM | POA: Diagnosis present

## 2018-05-12 DIAGNOSIS — Z9851 Tubal ligation status: Secondary | ICD-10-CM

## 2018-05-12 DIAGNOSIS — K5120 Ulcerative (chronic) proctitis without complications: Secondary | ICD-10-CM | POA: Diagnosis present

## 2018-05-12 DIAGNOSIS — F419 Anxiety disorder, unspecified: Secondary | ICD-10-CM | POA: Diagnosis present

## 2018-05-12 DIAGNOSIS — R030 Elevated blood-pressure reading, without diagnosis of hypertension: Secondary | ICD-10-CM | POA: Diagnosis present

## 2018-05-12 DIAGNOSIS — F418 Other specified anxiety disorders: Secondary | ICD-10-CM

## 2018-05-12 DIAGNOSIS — I9589 Other hypotension: Secondary | ICD-10-CM | POA: Diagnosis present

## 2018-05-12 DIAGNOSIS — R578 Other shock: Secondary | ICD-10-CM

## 2018-05-12 DIAGNOSIS — R1030 Lower abdominal pain, unspecified: Secondary | ICD-10-CM

## 2018-05-12 DIAGNOSIS — J9601 Acute respiratory failure with hypoxia: Secondary | ICD-10-CM | POA: Diagnosis not present

## 2018-05-12 DIAGNOSIS — Y92239 Unspecified place in hospital as the place of occurrence of the external cause: Secondary | ICD-10-CM | POA: Diagnosis not present

## 2018-05-12 DIAGNOSIS — R739 Hyperglycemia, unspecified: Secondary | ICD-10-CM | POA: Diagnosis present

## 2018-05-12 LAB — COMPREHENSIVE METABOLIC PANEL
ALBUMIN: 3.8 g/dL (ref 3.5–5.0)
ALT: 17 U/L (ref 0–44)
ANION GAP: 12 (ref 5–15)
AST: 43 U/L — ABNORMAL HIGH (ref 15–41)
Alkaline Phosphatase: 41 U/L (ref 38–126)
BUN: 12 mg/dL (ref 6–20)
CO2: 24 mmol/L (ref 22–32)
CREATININE: 0.73 mg/dL (ref 0.44–1.00)
Calcium: 9.3 mg/dL (ref 8.9–10.3)
Chloride: 104 mmol/L (ref 98–111)
Glucose, Bld: 148 mg/dL — ABNORMAL HIGH (ref 70–99)
POTASSIUM: 3.9 mmol/L (ref 3.5–5.1)
Sodium: 140 mmol/L (ref 135–145)
Total Bilirubin: 0.7 mg/dL (ref 0.3–1.2)
Total Protein: 6.3 g/dL — ABNORMAL LOW (ref 6.5–8.1)

## 2018-05-12 LAB — LIPASE, BLOOD: Lipase: 39 U/L (ref 11–51)

## 2018-05-12 LAB — CBC
HEMATOCRIT: 41 % (ref 36.0–46.0)
HEMOGLOBIN: 13.8 g/dL (ref 12.0–15.0)
MCH: 32.4 pg (ref 26.0–34.0)
MCHC: 33.7 g/dL (ref 30.0–36.0)
MCV: 96.2 fL (ref 78.0–100.0)
PLATELETS: 221 10*3/uL (ref 150–400)
RBC: 4.26 MIL/uL (ref 3.87–5.11)
RDW: 12.7 % (ref 11.5–15.5)
WBC: 5.9 10*3/uL (ref 4.0–10.5)

## 2018-05-12 MED ORDER — TRAZODONE HCL 50 MG PO TABS
100.0000 mg | ORAL_TABLET | Freq: Every day | ORAL | Status: DC
  Administered 2018-05-12: 100 mg via ORAL
  Filled 2018-05-12: qty 1

## 2018-05-12 MED ORDER — IBUPROFEN 200 MG PO TABS: 600.0000 mg | ORAL_TABLET | Freq: Four times a day (QID) | ORAL | Status: DC | PRN

## 2018-05-12 MED ORDER — ONDANSETRON HCL 4 MG PO TABS: 4.0000 mg | ORAL_TABLET | Freq: Four times a day (QID) | ORAL | Status: DC | PRN

## 2018-05-12 MED ORDER — HYDROMORPHONE HCL 1 MG/ML IJ SOLN
0.5000 mg | Freq: Once | INTRAMUSCULAR | Status: AC
  Administered 2018-05-12: 0.5 mg via INTRAVENOUS
  Filled 2018-05-12: qty 1

## 2018-05-12 MED ORDER — SENNA 8.6 MG PO TABS
1.0000 | ORAL_TABLET | Freq: Two times a day (BID) | ORAL | Status: DC
  Administered 2018-05-12: 8.6 mg via ORAL
  Filled 2018-05-12: qty 1

## 2018-05-12 MED ORDER — IOHEXOL 300 MG/ML  SOLN
100.0000 mL | Freq: Once | INTRAMUSCULAR | Status: AC | PRN
  Administered 2018-05-12: 100 mL via INTRAVENOUS

## 2018-05-12 MED ORDER — KETOROLAC TROMETHAMINE 30 MG/ML IJ SOLN
15.0000 mg | Freq: Once | INTRAMUSCULAR | Status: AC
  Administered 2018-05-12: 15 mg via INTRAVENOUS
  Filled 2018-05-12: qty 1

## 2018-05-12 MED ORDER — SODIUM CHLORIDE 0.9 % IV BOLUS
1000.0000 mL | Freq: Once | INTRAVENOUS | Status: AC
Start: 2018-05-12 — End: 2018-05-12
  Administered 2018-05-12: 1000 mL via INTRAVENOUS

## 2018-05-12 MED ORDER — MORPHINE SULFATE (PF) 4 MG/ML IV SOLN
4.0000 mg | Freq: Once | INTRAVENOUS | Status: AC
  Administered 2018-05-12: 4 mg via INTRAVENOUS
  Filled 2018-05-12: qty 1

## 2018-05-12 MED ORDER — OXYCODONE HCL 5 MG PO TABS
5.0000 mg | ORAL_TABLET | ORAL | Status: DC | PRN
  Administered 2018-05-12: 5 mg via ORAL
  Filled 2018-05-12: qty 1

## 2018-05-12 MED ORDER — HYDROMORPHONE HCL 1 MG/ML IJ SOLN
1.0000 mg | INTRAMUSCULAR | Status: DC | PRN
  Administered 2018-05-12 – 2018-05-13 (×5): 1 mg via INTRAVENOUS
  Filled 2018-05-12 (×5): qty 1

## 2018-05-12 MED ORDER — FENTANYL CITRATE (PF) 100 MCG/2ML IJ SOLN
100.0000 ug | Freq: Once | INTRAMUSCULAR | Status: AC
Start: 2018-05-12 — End: 2018-05-12
  Administered 2018-05-12: 100 ug via INTRAVENOUS
  Filled 2018-05-12: qty 2

## 2018-05-12 MED ORDER — ONDANSETRON HCL 4 MG/2ML IJ SOLN
4.0000 mg | Freq: Four times a day (QID) | INTRAMUSCULAR | Status: DC | PRN
  Administered 2018-05-13: 4 mg via INTRAVENOUS
  Filled 2018-05-12: qty 2

## 2018-05-12 MED ORDER — ONDANSETRON HCL 4 MG/2ML IJ SOLN
4.0000 mg | Freq: Once | INTRAMUSCULAR | Status: AC
  Administered 2018-05-12: 4 mg via INTRAVENOUS
  Filled 2018-05-12: qty 2

## 2018-05-12 MED ORDER — DOCUSATE SODIUM 100 MG PO CAPS
100.0000 mg | ORAL_CAPSULE | Freq: Two times a day (BID) | ORAL | Status: DC
  Administered 2018-05-12: 100 mg via ORAL
  Filled 2018-05-12: qty 1

## 2018-05-12 MED ORDER — HYDROMORPHONE HCL 2 MG PO TABS: 1.0000 mg | ORAL_TABLET | ORAL | Status: DC | PRN

## 2018-05-12 MED ORDER — DULOXETINE HCL 60 MG PO CPEP
120.0000 mg | ORAL_CAPSULE | Freq: Every day | ORAL | Status: DC
  Administered 2018-05-12: 120 mg via ORAL
  Filled 2018-05-12: qty 2

## 2018-05-12 MED ORDER — ALPRAZOLAM 0.5 MG PO TABS
0.5000 mg | ORAL_TABLET | Freq: Three times a day (TID) | ORAL | Status: DC | PRN
  Administered 2018-05-13: 0.5 mg via ORAL
  Filled 2018-05-12: qty 1

## 2018-05-12 MED ORDER — ENOXAPARIN SODIUM 40 MG/0.4ML ~~LOC~~ SOLN
40.0000 mg | SUBCUTANEOUS | Status: DC
  Administered 2018-05-12: 40 mg via SUBCUTANEOUS
  Filled 2018-05-12: qty 0.4

## 2018-05-12 NOTE — ED Notes (Signed)
Patient advised still in pain has made provider aware,

## 2018-05-12 NOTE — ED Provider Notes (Signed)
Care assumed from [facility], PA-C at shift change with labs and CT abd/pelvis pending.  In brief, this patient is a 57 y.o. F who presents for evaluation of LLQ abdominal pain that began last night. Reports associated nausea. No vomiting, fever.  Please see previous providers note for full history/physical.   PLAN: Patient with CT abd/pelvis and labs pending.   MDM:  Lipase unremarkable.  CBC without any significant leukocytosis, anemia.  CMP shows slight hyperglycemia.  Otherwise unremarkable.  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  She has some malignant ascites noted.  Discussed patient with Dr. Denman George (Gyn-Onc). Recommends obtaining at [facility] and [facility]. She will plan to arrange outpatient follow-up with patient.   Updated patient on results. She is having some more pain and nausea. Will give additional analgesics.   Reevaluation after additional analgesics.  Patient reports some improvement in pain.  Since he has standing up, walk around holding her abdomen in discomfort.  Will give additional analgesics.  Reevaluation after additional analgesics, patient states she is still having significant pain.  No vomiting but no improvement in her pain.  Vital signs are stable.  She is slightly hypertensive.  This point, patient has received morphine, Dilaudid, fentanyl, Toradol for her pain with minimal improvement in her analgesics.  I discussed with patient regarding treatment options.  At this time, given the significant inability to control patient's pain with multiple medications, patient may need admission for pain control.  I discussed this option with patient.  Plan to patient that her work-up would still need to be done with outpatient diagnostic.  Patient is understanding of this.  Will consult hospitalist.  Discussed patient with Dr. Daryll Drown (hospitalist). Will evaluate patient in the ED.   Hospitalist will admit.    1.  LLQ abdominal pain   2. Lower abdominal pain   3. Pelvic mass        Desma Mcgregor 05/12/18 [id number]    Quintella Reichert, MD 05/13/18 [phone]

## 2018-05-12 NOTE — ED Triage Notes (Signed)
Patient states that LLQ pain awakened her this a.m. Last BM was yesterday; loose stool.

## 2018-05-12 NOTE — ED Notes (Signed)
Patient transported to CT 

## 2018-05-12 NOTE — Telephone Encounter (Signed)
Called [facility] and spoke to patient's nurse, Carmelina Paddock, RN.  Advised her that new patient appointment has been scheduled with Dr. Gerarda Fraction on 05/15/18 at 2 pm, with arrival at 1:30 pm.  She verbalized agreement and will notify patient.

## 2018-05-12 NOTE — H&P (Signed)
History and Physical    Jacqueline Castro HFS:[id number] DOB: [birth year] DOA: 05/12/2018  PCP: [name], MD Patient coming from: Home  Chief Complaint: LLQ abdominal pain  HPI: Jacqueline Castro is a 57 y.o. female with medical history significant of ulcerative colitis, anxiety, anemia who presents for LLQ abdominal pain.  She notes that the pain has been present off and on for about 2 months, but this morning it started and was much more severe and constant.  Pain is 10/10 causing her to feel she has to move constantly, sharp and throbbing.  She has had morphine, dilaudid, toradol and fentanyl in the ED without relief.  Rest does not make it better.  She has had bloating as well that she only noticed the first time this morning.  She has had normal BM and no constipation or blood loss that she is aware of.   ED Course: In the ED, she was noted to have severe pain, elevated BP at times, relatively normal blood work, but a CT abdomen pelvis showed a large pelvic mass and ascites, likely malignant.  The ED discussed with gynecology/oncology and she was given an appointment for Monday.  She had persistent pain despite treatment.   Review of Systems: As per HPI otherwise 10 point review of systems negative.    Past Medical History:  Diagnosis Date  . Anemia   . Anxiety   . Chest pain   . Palpitations   . Proctitis   . Ulcerative colitis     Past Surgical History:  Procedure Laterality Date  . LUMBAR DISC SURGERY     Neck  . TUBAL LIGATION     Reviewed with patient.   reports that she has never smoked. She has never used smokeless tobacco. She reports that she does not drink alcohol or use drugs.  Allergies  Allergen Reactions  . Codeine     REACTION: vomiting   Reviewed with patient.  Family History  Problem Relation Age of Onset  . Diabetes Mother   . Colon cancer Paternal Grandfather 87  . Colon cancer Paternal Uncle 26  . Heart Problems Sister        bicuspid aortic valve       Prior to Admission medications   Medication Sig Start Date End Date Taking? Authorizing Provider  ALPRAZolam Duanne Moron) 0.5 MG tablet Take 0.5 mg by mouth 3 (three) times daily as needed for anxiety.  03/26/14  Yes [provider]  DULoxetine (CYMBALTA) 30 MG capsule Take 120 mg by mouth daily.    Yes [provider]  traZODone (DESYREL) 50 MG tablet Take 100 mg by mouth at bedtime.   Yes [provider]           Physical Exam:  Constitutional: appears uncomfortable, moving in bed, at times in tears Vitals:   05/12/18 1230 05/12/18 1245 05/12/18 1400 05/12/18 1446  BP: (!) 110/92 (!) 143/84  (!) 155/85  Pulse: 78 97 75 67  Resp:    16  Temp:      TempSrc:      SpO2: 97% 98% 98% 100%  Weight:      Height:       Eyes:  lids and conjunctivae normal ENMT: Mucous membranes are moist. Normal dentition.  Neck: normal, supple Respiratory: clear to auscultation bilaterally, no wheezing, no crackles. Normal effort Cardiovascular: RR, NR, no murmur, no LE swelling Abdomen: + swelling and distention, tender throughout but worse in LLQ Musculoskeletal: No joint deformity  upper and lower extremities.  Normal muscle tone.  Skin: no rashes, lesions, ulcers on exposed skin Neurologic: Moving all extremities without issue, no sensory deficit Psychiatric: Very uncomfortable, mood is distressed  Labs on Admission: I have personally reviewed following labs and imaging studies  CBC: Recent Labs  Lab 05/12/18 0521  WBC 5.9  HGB 13.8  HCT 41.0  MCV 96.2  PLT 417   Basic Metabolic Panel: Recent Labs  Lab 05/12/18 0521  NA 140  K 3.9  CL 104  CO2 24  GLUCOSE 148*  BUN 12  CREATININE 0.73  CALCIUM 9.3   GFR: Estimated Creatinine Clearance: 68.1 mL/min (by C-G formula based on SCr of 0.73 mg/dL). Liver Function Tests: Recent Labs  Lab 05/12/18 0521  AST 43*  ALT 17  ALKPHOS 41  BILITOT 0.7  PROT 6.3*  ALBUMIN 3.8   Recent Labs  Lab  05/12/18 0521  LIPASE 39   No results for input(s): AMMONIA in the last 168 hours. Coagulation Profile: No results for input(s): INR, PROTIME in the last 168 hours. Cardiac Enzymes: No results for input(s): CKTOTAL, CKMB, CKMBINDEX, TROPONINI in the last 168 hours. BNP (last 3 results) No results for input(s): PROBNP in the last 8760 hours. HbA1C: No results for input(s): HGBA1C in the last 72 hours. CBG: No results for input(s): GLUCAP in the last 168 hours. Lipid Profile: No results for input(s): CHOL, HDL, LDLCALC, TRIG, CHOLHDL, LDLDIRECT in the last 72 hours. Thyroid Function Tests: No results for input(s): TSH, T4TOTAL, FREET4, T3FREE, THYROIDAB in the last 72 hours. Anemia Panel: No results for input(s): VITAMINB12, FOLATE, FERRITIN, TIBC, IRON, RETICCTPCT in the last 72 hours. Urine analysis:    Component Value Date/Time   COLORURINE LT YELLOW 09/13/2007 1610   APPEARANCEUR Clear 09/13/2007 1610   LABSPEC 1.025 09/13/2007 1610   PHURINE 6.0 09/13/2007 1610   GLUCOSEU NEGATIVE 09/13/2007 1610   BILIRUBINUR NEGATIVE 09/13/2007 1610   KETONESUR NEGATIVE 09/13/2007 1610   UROBILINOGEN 0.2 mg/dL 09/13/2007 1610   NITRITE Negative 09/13/2007 1610   LEUKOCYTESUR Trace (A) 09/13/2007 1610    Radiological Exams on Admission: Ct Abdomen Pelvis W Contrast  Result Date: 05/12/2018 CLINICAL DATA:  Left lower quadrant abdominal pain beginning a few hours ago. EXAM: CT ABDOMEN AND PELVIS WITH CONTRAST TECHNIQUE: Multidetector CT imaging of the abdomen and pelvis was performed using the standard protocol following bolus administration of intravenous contrast. CONTRAST:  123m OMNIPAQUE IOHEXOL 300 MG/ML  SOLN COMPARISON:  None. FINDINGS: Lower chest: Normal Hepatobiliary: Benign 9 mm cyst at the dome of the liver. No other liver parenchymal finding. No calcified gallstones. Pancreas: Normal Spleen: Normal Adrenals/Urinary Tract: Adrenal glands are normal. Right kidney is normal. Left  kidney is normal except for a 2.6 cm cyst. The bladder is intrinsically normal. Stomach/Bowel: No primary bowel finding. Vascular/Lymphatic: Normal Reproductive: Complex pelvic mass measuring 19 x 12 x 13 cm likely of ovarian origin. Ascites, likely malignant. I do not see any obvious distant nodular components. The uterus itself is normal. Other: No free air. 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 No evidence of bowel pathology or other organ pathology. Electronically Signed   By: MNelson ChimesM.D.   On: 05/12/2018 07:48    Assessment/Plan Ovarian mass with LLQ pain - Gyn/Onc contacted, appointment Monday - CA 125 and CEA pending - Pain control with oral and IV dilaudid.  If PO/IV regimen insufficient, would  consider PCA or discuss with oncology or palliative care about possible alternative regimens - Bowel regimen to avoid constipation - Further work up of mass per Gyn/Onc  Anxiety, on chronic therapy - Continue Alprazolam, duloxetine and trazodone at home doses    H/O Ulcerative proctitis, chronic - Currently not on therapy, monitor for changes      DVT prophylaxis: Lovenox Code Status: Full Family Communication: Daughter at bedside Disposition Plan: Admit for pain control Consults called: Gyn Onc in ED, Dr. Denman George Admission status: Obs, med-surg   Gilles Chiquito MD [facility] Pager [phone]  If 7PM-7AM, please contact night-coverage [email or web address] Password [facility]  05/12/2018, 3:04 PM

## 2018-05-12 NOTE — ED Provider Notes (Signed)
[facility]ERGENC[facility] DEPARTMENT Provider Note   CSN: [id number] Arrival date & time: 05/12/18  0453     History   Chief Complaint Chief Complaint  Patient presents with  . Abdominal Pain    HPI Jacqueline Castro is a 57 y.o. female.  HPI 57 year old female presents to the ED for evaluation of left lower quadrant abdominal pain.  Onset several hours prior to arrival today.  She reports the pain is cramping and stabbing in nature.  Does not radiate.  The pain is been constant.  Worse with palpation.  She has not taken anything for her symptoms prior to arrival.  Reports associated nausea but denies any emesis.  Denies any fevers, urinary symptoms or change in bowel habits.  Denies any history of similar pain.  Nothing makes better or worse.  Patient states that "it feels like a hard mass".  Pt denies any fever, chill, ha, vision changes, lightheadedness, dizziness, congestion, neck pain, cp, sob, cough, v/d, urinary symptoms, vaginal symptoms, change in bowel habits, melena, hematochezia, lower extremity paresthesias.  Past Medical History:  Diagnosis Date  . Anemia   . Anxiety   . Chest pain   . Palpitations   . Proctitis   . Ulcerative colitis     Patient Active Problem List   Diagnosis Date Noted  . Palpitations 04/10/2014  . Chest pain 04/10/2014  . Ulcerative proctitis, chronic ([place]) 08/11/2011  . ULCERATIVE PROCTITIS 02/23/2008  . IRRITABLE BOWEL SYNDROME 02/23/2008  . DIARRHEA, ACUTE, CHRONIC 02/23/2008  . RECTAL BLEEDING 08/11/2004    Past Surgical History:  Procedure Laterality Date  . LUMBAR DISC SURGERY     Neck  . TUBAL LIGATION       OB History   None      Home Medications    Prior to Admission medications   Medication Sig Start Date End Date Taking? Authorizing Provider  ALPRAZolam Duanne Moron) 0.5 MG tablet Take 0.5 mg by mouth 3 (three) times daily as needed for anxiety.  03/26/14  Yes [provider]  DULoxetine (CYMBALTA)  30 MG capsule Take 120 mg by mouth daily.    Yes [provider]  traZODone (DESYREL) 50 MG tablet Take 100 mg by mouth at bedtime.   Yes [provider]  pantoprazole (PROTONIX) 20 MG tablet Take 1 tablet (20 mg total) by mouth daily. Patient not taking: Reported on 05/12/2018 04/19/16   [name], MD    Family History Family History  Problem Relation Age of Onset  . Diabetes Mother   . Colon cancer Paternal Grandfather 36  . Colon cancer Paternal Uncle 34  . Heart Problems Sister        bicuspid aortic valve    Social History Social History   Tobacco Use  . Smoking status: Never Smoker  . Smokeless tobacco: Never Used  Substance Use Topics  . Alcohol use: No    Comment: occasionally  . Drug use: No     Allergies   Codeine   Review of Systems Review of Systems  All other systems reviewed and are negative.    Physical Exam Updated Vital Signs BP (!) 146/76   Pulse 67   Ht 5' 5"  (1.651 m)   Wt 65.8 kg (145 lb)   LMP 08/29/2011   SpO2 96%   BMI 24.13 kg/m   Physical Exam  Constitutional: She is oriented to person, place, and time. She appears well-developed and well-nourished.  Non-toxic appearance. No distress.  HENT:  Head: Normocephalic and atraumatic.  Nose: Nose normal.  Mouth/Throat: Oropharynx is clear and moist.  Eyes: Pupils are equal, round, and reactive to light. Conjunctivae are normal. Right eye exhibits no discharge. Left eye exhibits no discharge.  Neck: Normal range of motion. Neck supple.  Cardiovascular: Normal rate, regular rhythm, normal heart sounds and intact distal pulses.  Pulmonary/Chest: Effort normal and breath sounds normal. No respiratory distress. She exhibits no tenderness.  Abdominal: Soft. Normal appearance and bowel sounds are normal. There is tenderness in the left lower quadrant. There is guarding. There is no rigidity, no rebound, no CVA tenderness, no tenderness at McBurney's point and negative  Murphy's sign.  Musculoskeletal: Normal range of motion. She exhibits no tenderness.  Lymphadenopathy:    She has no cervical adenopathy.  Neurological: She is alert and oriented to person, place, and time.  Skin: Skin is warm and dry. Capillary refill takes less than 2 seconds.  Psychiatric: Her behavior is normal. Judgment and thought content normal.  Nursing note and vitals reviewed.    ED Treatments / Results  Labs (all labs ordered are listed, but only abnormal results are displayed) Labs Reviewed  COMPREHENSIVE METABOLIC PANEL - Abnormal; Notable for the following components:      Result Value   Glucose, Bld 148 (*)    Total Protein 6.3 (*)    AST 43 (*)    All other components within normal limits  LIPASE, BLOOD  CBC  URINALYSIS, ROUTINE W REFLEX MICROSCOPIC    EKG None  Radiology No results found.  Procedures Procedures (including critical care time)  Medications Ordered in ED Medications  sodium chloride 0.9 % bolus 1,000 mL (0 mLs Intravenous Stopped 05/12/18 0643)  morphine 4 MG/ML injection 4 mg (4 mg Intravenous Given 05/12/18 0524)  ondansetron (ZOFRAN) injection 4 mg (4 mg Intravenous Given 05/12/18 0523)     Initial Impression / Assessment and Plan / ED Course  I have reviewed the triage vital signs and the nursing notes.  Pertinent labs & imaging results that were available during my care of the patient were reviewed by me and considered in my medical decision making (see chart for details).     She presents to the ED for evaluation of left lower quadrant abdominal pain.  Vital signs reassuring.  Patient does have pain to palpation left lower quadrant with some mild guarding.  No signs of peritonitis.  Bowel sounds present in all 4 quadrants.  Lab work and imaging pending at this time.  Care handoff to [place]. Pt has pending at this time labs and imaging. Care dicussed and plan agreed upon with oncoming PA. Pt updated on plan of care and is currently  hemodynamically stable at this time with normal vs.     Final Clinical Impressions(s) / ED Diagnoses   Final diagnoses:  LLQ abdominal pain    ED Discharge Orders    None       Aaron Edelman 05/12/18 9935    Veryl Speak, MD 05/12/18 2259

## 2018-05-13 ENCOUNTER — Inpatient Hospital Stay (HOSPITAL_COMMUNITY): Payer: 59 | Admitting: Anesthesiology

## 2018-05-13 ENCOUNTER — Inpatient Hospital Stay (HOSPITAL_COMMUNITY): Payer: 59

## 2018-05-13 ENCOUNTER — Encounter (HOSPITAL_COMMUNITY)
Admission: EM | Disposition: A | Discharge status: Home / Self Care | Payer: Self-pay | Source: Home / Self Care | Attending: Gynecologic Oncology

## 2018-05-13 ENCOUNTER — Encounter (HOSPITAL_COMMUNITY): Payer: Self-pay | Admitting: Radiology

## 2018-05-13 DIAGNOSIS — J9601 Acute respiratory failure with hypoxia: Secondary | ICD-10-CM | POA: Diagnosis not present

## 2018-05-13 DIAGNOSIS — C562 Malignant neoplasm of left ovary: Secondary | ICD-10-CM | POA: Diagnosis present

## 2018-05-13 DIAGNOSIS — J9600 Acute respiratory failure, unspecified whether with hypoxia or hypercapnia: Secondary | ICD-10-CM | POA: Diagnosis not present

## 2018-05-13 DIAGNOSIS — Z9851 Tubal ligation status: Secondary | ICD-10-CM | POA: Diagnosis not present

## 2018-05-13 DIAGNOSIS — R1084 Generalized abdominal pain: Secondary | ICD-10-CM

## 2018-05-13 DIAGNOSIS — K661 Hemoperitoneum: Secondary | ICD-10-CM | POA: Diagnosis present

## 2018-05-13 DIAGNOSIS — D62 Acute posthemorrhagic anemia: Secondary | ICD-10-CM | POA: Diagnosis not present

## 2018-05-13 DIAGNOSIS — J9811 Atelectasis: Secondary | ICD-10-CM | POA: Diagnosis not present

## 2018-05-13 DIAGNOSIS — Z79899 Other long term (current) drug therapy: Secondary | ICD-10-CM | POA: Diagnosis not present

## 2018-05-13 DIAGNOSIS — R402250 Coma scale, best verbal response, oriented, unspecified time: Secondary | ICD-10-CM | POA: Diagnosis present

## 2018-05-13 DIAGNOSIS — K5120 Ulcerative (chronic) proctitis without complications: Secondary | ICD-10-CM | POA: Diagnosis present

## 2018-05-13 DIAGNOSIS — R402130 Coma scale, eyes open, to sound, unspecified time: Secondary | ICD-10-CM | POA: Diagnosis present

## 2018-05-13 DIAGNOSIS — F419 Anxiety disorder, unspecified: Secondary | ICD-10-CM | POA: Diagnosis present

## 2018-05-13 DIAGNOSIS — T41295A Adverse effect of other general anesthetics, initial encounter: Secondary | ICD-10-CM | POA: Diagnosis not present

## 2018-05-13 DIAGNOSIS — R571 Hypovolemic shock: Secondary | ICD-10-CM | POA: Diagnosis present

## 2018-05-13 DIAGNOSIS — I9589 Other hypotension: Secondary | ICD-10-CM | POA: Diagnosis not present

## 2018-05-13 DIAGNOSIS — Z885 Allergy status to narcotic agent status: Secondary | ICD-10-CM | POA: Diagnosis not present

## 2018-05-13 DIAGNOSIS — N839 Noninflammatory disorder of ovary, fallopian tube and broad ligament, unspecified: Secondary | ICD-10-CM | POA: Diagnosis not present

## 2018-05-13 DIAGNOSIS — R1900 Intra-abdominal and pelvic swelling, mass and lump, unspecified site: Secondary | ICD-10-CM

## 2018-05-13 DIAGNOSIS — Y92239 Unspecified place in hospital as the place of occurrence of the external cause: Secondary | ICD-10-CM | POA: Diagnosis not present

## 2018-05-13 DIAGNOSIS — R739 Hyperglycemia, unspecified: Secondary | ICD-10-CM | POA: Diagnosis present

## 2018-05-13 DIAGNOSIS — R180 Malignant ascites: Secondary | ICD-10-CM | POA: Diagnosis present

## 2018-05-13 DIAGNOSIS — I952 Hypotension due to drugs: Secondary | ICD-10-CM | POA: Diagnosis not present

## 2018-05-13 DIAGNOSIS — R402360 Coma scale, best motor response, obeys commands, unspecified time: Secondary | ICD-10-CM | POA: Diagnosis present

## 2018-05-13 DIAGNOSIS — R578 Other shock: Secondary | ICD-10-CM | POA: Diagnosis present

## 2018-05-13 DIAGNOSIS — N179 Acute kidney failure, unspecified: Secondary | ICD-10-CM | POA: Diagnosis not present

## 2018-05-13 DIAGNOSIS — R030 Elevated blood-pressure reading, without diagnosis of hypertension: Secondary | ICD-10-CM | POA: Diagnosis present

## 2018-05-13 HISTORY — PX: LAPAROTOMY: SHX154

## 2018-05-13 LAB — COMPREHENSIVE METABOLIC PANEL
ALK PHOS: 29 U/L — AB (ref 38–126)
ALT: 14 U/L (ref 0–44)
AST: 43 U/L — ABNORMAL HIGH (ref 15–41)
Albumin: 3.2 g/dL — ABNORMAL LOW (ref 3.5–5.0)
Anion gap: 11 (ref 5–15)
BILIRUBIN TOTAL: 0.5 mg/dL (ref 0.3–1.2)
BUN: 20 mg/dL (ref 6–20)
CALCIUM: 8.3 mg/dL — AB (ref 8.9–10.3)
CO2: 22 mmol/L (ref 22–32)
CREATININE: 1.75 mg/dL — AB (ref 0.44–1.00)
Chloride: 99 mmol/L (ref 98–111)
GFR calc non Af Amer: 31 mL/min — ABNORMAL LOW (ref >60)
GFR, EST AFRICAN AMERICAN: 36 mL/min — AB (ref >60)
GLUCOSE: 189 mg/dL — AB (ref 70–99)
Potassium: 4.5 mmol/L (ref 3.5–5.1)
SODIUM: 132 mmol/L — AB (ref 135–145)
TOTAL PROTEIN: 4.9 g/dL — AB (ref 6.5–8.1)

## 2018-05-13 LAB — POCT I-STAT 7, (LYTES, BLD GAS, ICA,H+H)
ACID-BASE DEFICIT: 2 mmol/L (ref 0.0–2.0)
Acid-base deficit: 8 mmol/L — ABNORMAL HIGH (ref 0.0–2.0)
Acid-base deficit: 3 mmol/L — ABNORMAL HIGH (ref 0.0–2.0)
Acid-base deficit: 4 mmol/L — ABNORMAL HIGH (ref 0.0–2.0)
BICARBONATE: 19.2 mmol/L — AB (ref 20.0–28.0)
Bicarbonate: 22.6 mmol/L (ref 20.0–28.0)
Bicarbonate: 21.6 mmol/L (ref 20.0–28.0)
Bicarbonate: 20.2 mmol/L (ref 20.0–28.0)
Calcium, Ion: 0.7 mmol/L — CL (ref 1.15–1.40)
Calcium, Ion: 1.04 mmol/L — ABNORMAL LOW (ref 1.15–1.40)
Calcium, Ion: 0.89 mmol/L — CL (ref 1.15–1.40)
Calcium, Ion: 0.98 mmol/L — ABNORMAL LOW (ref 1.15–1.40)
HCT: 24 % — ABNORMAL LOW (ref 36.0–46.0)
HCT: 28 % — ABNORMAL LOW (ref 36.0–46.0)
HCT: 33 % — ABNORMAL LOW (ref 36.0–46.0)
HEMATOCRIT: 32 % — AB (ref 36.0–46.0)
Hemoglobin: 8.2 g/dL — ABNORMAL LOW (ref 12.0–15.0)
Hemoglobin: 9.5 g/dL — ABNORMAL LOW (ref 12.0–15.0)
Hemoglobin: 10.9 g/dL — ABNORMAL LOW (ref 12.0–15.0)
Hemoglobin: 11.2 g/dL — ABNORMAL LOW (ref 12.0–15.0)
O2 SAT: 100 %
O2 Saturation: 100 %
O2 Saturation: 100 %
O2 Saturation: 98 %
PH ART: 7.243 — AB (ref 7.350–7.450)
PH ART: 7.376 (ref 7.350–7.450)
PO2 ART: 367 mmHg — AB (ref 83.0–108.0)
Patient temperature: 34
Patient temperature: 36.1
Potassium: 5.7 mmol/L — ABNORMAL HIGH (ref 3.5–5.1)
Potassium: 5.9 mmol/L — ABNORMAL HIGH (ref 3.5–5.1)
Potassium: 5.9 mmol/L — ABNORMAL HIGH (ref 3.5–5.1)
Potassium: 4.9 mmol/L (ref 3.5–5.1)
SODIUM: 132 mmol/L — AB (ref 135–145)
Sodium: 130 mmol/L — ABNORMAL LOW (ref 135–145)
Sodium: 133 mmol/L — ABNORMAL LOW (ref 135–145)
Sodium: 134 mmol/L — ABNORMAL LOW (ref 135–145)
TCO2: 21 mmol/L — ABNORMAL LOW (ref 22–32)
TCO2: 24 mmol/L (ref 22–32)
TCO2: 23 mmol/L (ref 22–32)
TCO2: 21 mmol/L — AB (ref 22–32)
pCO2 arterial: 39.5 mmHg (ref 32.0–48.0)
pCO2 arterial: 42.9 mmHg (ref 32.0–48.0)
pCO2 arterial: 36.5 mmHg (ref 32.0–48.0)
pCO2 arterial: 32.1 mmHg (ref 32.0–48.0)
pH, Arterial: 7.367 (ref 7.350–7.450)
pH, Arterial: 7.408 (ref 7.350–7.450)
pO2, Arterial: 196 mmHg — ABNORMAL HIGH (ref 83.0–108.0)
pO2, Arterial: 447 mmHg — ABNORMAL HIGH (ref 83.0–108.0)
pO2, Arterial: 110 mmHg — ABNORMAL HIGH (ref 83.0–108.0)

## 2018-05-13 LAB — POCT I-STAT 3, ART BLOOD GAS (G3+)
Acid-base deficit: 2 mmol/L (ref 0.0–2.0)
Acid-base deficit: 3 mmol/L — ABNORMAL HIGH (ref 0.0–2.0)
BICARBONATE: 24 mmol/L (ref 20.0–28.0)
Bicarbonate: 25 mmol/L (ref 20.0–28.0)
O2 Saturation: 100 %
O2 Saturation: 100 %
PCO2 ART: 57.4 mmHg — AB (ref 32.0–48.0)
PH ART: 7.352 (ref 7.350–7.450)
PO2 ART: 213 mmHg — AB (ref 83.0–108.0)
PO2 ART: 300 mmHg — AB (ref 83.0–108.0)
Patient temperature: 37
Patient temperature: 98.3
TCO2: 25 mmol/L (ref 22–32)
TCO2: 27 mmol/L (ref 22–32)
pCO2 arterial: 43.3 mmHg (ref 32.0–48.0)
pH, Arterial: 7.247 — ABNORMAL LOW (ref 7.350–7.450)

## 2018-05-13 LAB — CBC WITH DIFFERENTIAL/PLATELET
Abs Immature Granulocytes: 0.1 10*3/uL (ref 0.0–0.1)
Basophils Absolute: 0 10*3/uL (ref 0.0–0.1)
Basophils Relative: 0 %
Eosinophils Absolute: 0 10*3/uL (ref 0.0–0.7)
Eosinophils Relative: 0 %
HCT: 21.3 % — ABNORMAL LOW (ref 36.0–46.0)
Hemoglobin: 6.9 g/dL — CL (ref 12.0–15.0)
IMMATURE GRANULOCYTES: 1 %
LYMPHS PCT: 13 %
Lymphs Abs: 2 10*3/uL (ref 0.7–4.0)
MCH: 32.2 pg (ref 26.0–34.0)
MCHC: 32.4 g/dL (ref 30.0–36.0)
MCV: 99.5 fL (ref 78.0–100.0)
MONO ABS: 0.9 10*3/uL (ref 0.1–1.0)
Monocytes Relative: 6 %
NEUTROS ABS: 12.4 10*3/uL — AB (ref 1.7–7.7)
Neutrophils Relative %: 80 %
PLATELETS: 197 10*3/uL (ref 150–400)
RBC: 2.14 MIL/uL — AB (ref 3.87–5.11)
RDW: 12.9 % (ref 11.5–15.5)
WBC: 15.4 10*3/uL — ABNORMAL HIGH (ref 4.0–10.5)

## 2018-05-13 LAB — PROTIME-INR
INR: 1.36
INR: 1.36
PROTHROMBIN TIME: 16.6 s — AB (ref 11.4–15.2)
Prothrombin Time: 16.6 seconds — ABNORMAL HIGH (ref 11.4–15.2)

## 2018-05-13 LAB — FIBRINOGEN: FIBRINOGEN: 252 mg/dL (ref 210–475)

## 2018-05-13 LAB — APTT
APTT: 29 s (ref 24–36)
APTT: 28 s (ref 24–36)

## 2018-05-13 LAB — DIC (DISSEMINATED INTRAVASCULAR COAGULATION) PANEL
APTT: 31 s (ref 24–36)
PLATELETS: 68 10*3/uL — AB (ref 150–400)

## 2018-05-13 LAB — MRSA PCR SCREENING: MRSA by PCR: NEGATIVE

## 2018-05-13 LAB — PREPARE RBC (CROSSMATCH)

## 2018-05-13 LAB — CA 125: Cancer Antigen (CA) 125: 132 U/mL — ABNORMAL HIGH (ref 0.0–38.1)

## 2018-05-13 LAB — DIC (DISSEMINATED INTRAVASCULAR COAGULATION)PANEL
D-Dimer, Quant: 0.84 ug/mL-FEU — ABNORMAL HIGH (ref 0.00–0.50)
Fibrinogen: 228 mg/dL (ref 210–475)
INR: 1.35
Prothrombin Time: 16.5 seconds — ABNORMAL HIGH (ref 11.4–15.2)
Smear Review: NONE SEEN

## 2018-05-13 LAB — ABO/RH: ABO/RH(D): B NEG

## 2018-05-13 LAB — CEA: CEA: 2 ng/mL (ref 0.0–4.7)

## 2018-05-13 LAB — HEMOGLOBIN AND HEMATOCRIT, BLOOD
HEMATOCRIT: 26.1 % — AB (ref 36.0–46.0)
HEMOGLOBIN: 8.5 g/dL — AB (ref 12.0–15.0)

## 2018-05-13 LAB — GLUCOSE, CAPILLARY: Glucose-Capillary: 165 mg/dL — ABNORMAL HIGH (ref 70–99)

## 2018-05-13 SURGERY — LAPAROTOMY, EXPLORATORY
Anesthesia: General | Site: Abdomen

## 2018-05-13 MED ORDER — SUCCINYLCHOLINE CHLORIDE 20 MG/ML IJ SOLN
INTRAMUSCULAR | Status: DC | PRN
  Administered 2018-05-13: 120 mg via INTRAVENOUS

## 2018-05-13 MED ORDER — FENTANYL CITRATE (PF) 250 MCG/5ML IJ SOLN
INTRAMUSCULAR | Status: AC
  Filled 2018-05-13: qty 5

## 2018-05-13 MED ORDER — BUPIVACAINE HCL (PF) 0.25 % IJ SOLN
INTRAMUSCULAR | Status: DC | PRN
  Administered 2018-05-13: 20 mL

## 2018-05-13 MED ORDER — KCL IN DEXTROSE-NACL 20-5-0.45 MEQ/L-%-% IV SOLN
INTRAVENOUS | Status: DC
  Administered 2018-05-13: 23:00:00 via INTRAVENOUS
  Filled 2018-05-13: qty 1000

## 2018-05-13 MED ORDER — ONDANSETRON HCL 4 MG/2ML IJ SOLN
4.0000 mg | Freq: Once | INTRAMUSCULAR | Status: AC
  Administered 2018-05-13: 4 mg via INTRAVENOUS
  Filled 2018-05-13: qty 2

## 2018-05-13 MED ORDER — LIDOCAINE HCL (CARDIAC) PF 100 MG/5ML IV SOSY
PREFILLED_SYRINGE | INTRAVENOUS | Status: DC | PRN
  Administered 2018-05-13: 60 mg via INTRAVENOUS

## 2018-05-13 MED ORDER — SODIUM CHLORIDE 0.9 % IV SOLN
INTRAVENOUS | Status: DC | PRN
  Administered 2018-05-13: 50 ug/min via INTRAVENOUS

## 2018-05-13 MED ORDER — ORAL CARE MOUTH RINSE
15.0000 mL | OROMUCOSAL | Status: DC
  Administered 2018-05-14 (×4): 15 mL via OROMUCOSAL

## 2018-05-13 MED ORDER — FAMOTIDINE IN NACL 20-0.9 MG/50ML-% IV SOLN
20.0000 mg | Freq: Two times a day (BID) | INTRAVENOUS | Status: DC
  Administered 2018-05-13: 20 mg via INTRAVENOUS
  Filled 2018-05-13: qty 50

## 2018-05-13 MED ORDER — SODIUM CHLORIDE 0.9 % IJ SOLN
INTRAMUSCULAR | Status: AC
  Filled 2018-05-13: qty 40

## 2018-05-13 MED ORDER — SODIUM CHLORIDE 0.9 % IV BOLUS
1000.0000 mL | Freq: Once | INTRAVENOUS | Status: AC
  Administered 2018-05-13: 1000 mL via INTRAVENOUS

## 2018-05-13 MED ORDER — HYDRALAZINE HCL 20 MG/ML IJ SOLN
10.0000 mg | INTRAMUSCULAR | Status: DC | PRN
  Administered 2018-05-13: 10 mg via INTRAVENOUS
  Filled 2018-05-13: qty 1

## 2018-05-13 MED ORDER — ASPIRIN 300 MG RE SUPP
300.0000 mg | RECTAL | Status: AC
  Administered 2018-05-13: 300 mg via RECTAL
  Filled 2018-05-13: qty 1

## 2018-05-13 MED ORDER — SODIUM CHLORIDE 0.9% IV SOLUTION: Freq: Once | INTRAVENOUS | Status: AC

## 2018-05-13 MED ORDER — SODIUM CHLORIDE 0.9 % IJ SOLN
INTRAMUSCULAR | Status: DC | PRN
  Administered 2018-05-13: 20 mL

## 2018-05-13 MED ORDER — FENTANYL CITRATE (PF) 100 MCG/2ML IJ SOLN
INTRAMUSCULAR | Status: DC | PRN
  Administered 2018-05-13: 50 ug via INTRAVENOUS
  Administered 2018-05-13: 150 ug via INTRAVENOUS
  Administered 2018-05-13 (×4): 50 ug via INTRAVENOUS

## 2018-05-13 MED ORDER — IOPAMIDOL (ISOVUE-370) INJECTION 76%
80.0000 mL | Freq: Once | INTRAVENOUS | Status: AC | PRN
  Administered 2018-05-13: 80 mL via INTRAVENOUS

## 2018-05-13 MED ORDER — PROPOFOL 500 MG/50ML IV EMUL
INTRAVENOUS | Status: DC | PRN
  Administered 2018-05-13: 50 ug/kg/min via INTRAVENOUS

## 2018-05-13 MED ORDER — CEFAZOLIN SODIUM-DEXTROSE 2-4 GM/100ML-% IV SOLN
INTRAVENOUS | Status: AC
  Filled 2018-05-13: qty 100

## 2018-05-13 MED ORDER — IOPAMIDOL (ISOVUE-370) INJECTION 76%
INTRAVENOUS | Status: AC
  Filled 2018-05-13: qty 100

## 2018-05-13 MED ORDER — BUPIVACAINE LIPOSOME 1.3 % IJ SUSP
INTRAMUSCULAR | Status: DC | PRN
  Administered 2018-05-13: 20 mL

## 2018-05-13 MED ORDER — ONDANSETRON HCL 4 MG PO TABS: 4.0000 mg | ORAL_TABLET | Freq: Four times a day (QID) | ORAL | Status: DC | PRN

## 2018-05-13 MED ORDER — SODIUM CHLORIDE 0.9 % IV SOLN: 250.0000 mL | INTRAVENOUS | Status: DC | PRN

## 2018-05-13 MED ORDER — 0.9 % SODIUM CHLORIDE (POUR BTL) OPTIME
TOPICAL | Status: DC | PRN
  Administered 2018-05-13: 2000 mL
  Administered 2018-05-13: 1000 mL

## 2018-05-13 MED ORDER — SODIUM CHLORIDE 0.9 % IV BOLUS: 1000.0000 mL | Freq: Once | INTRAVENOUS | Status: DC

## 2018-05-13 MED ORDER — SODIUM CHLORIDE 0.9% IV SOLUTION: Freq: Once | INTRAVENOUS | Status: DC

## 2018-05-13 MED ORDER — PHENYLEPHRINE HCL-NACL 10-0.9 MG/250ML-% IV SOLN
0.0000 ug/min | INTRAVENOUS | Status: DC
Start: 2018-05-13 — End: 2018-05-13
  Administered 2018-05-13: 20 ug/min via INTRAVENOUS
  Administered 2018-05-13: 50 ug/min via INTRAVENOUS
  Filled 2018-05-13 (×4): qty 250

## 2018-05-13 MED ORDER — SODIUM CHLORIDE 0.9% IV SOLUTION
Freq: Once | INTRAVENOUS | Status: AC
  Administered 2018-05-13: 12:00:00 via INTRAVENOUS

## 2018-05-13 MED ORDER — ROCURONIUM BROMIDE 100 MG/10ML IV SOLN
INTRAVENOUS | Status: DC | PRN
  Administered 2018-05-13: 20 mg via INTRAVENOUS
  Administered 2018-05-13: 50 mg via INTRAVENOUS

## 2018-05-13 MED ORDER — ONDANSETRON HCL 4 MG/2ML IJ SOLN: 4.0000 mg | Freq: Four times a day (QID) | INTRAMUSCULAR | Status: DC | PRN

## 2018-05-13 MED ORDER — CEFAZOLIN SODIUM-DEXTROSE 2-3 GM-%(50ML) IV SOLR
INTRAVENOUS | Status: DC | PRN
  Administered 2018-05-13: 2 g via INTRAVENOUS

## 2018-05-13 MED ORDER — CALCIUM CHLORIDE 10 % IV SOLN
INTRAVENOUS | Status: DC | PRN
  Administered 2018-05-13 (×2): 200 mg via INTRAVENOUS
  Administered 2018-05-13 (×3): 100 mg via INTRAVENOUS

## 2018-05-13 MED ORDER — LACTATED RINGERS IV SOLN
INTRAVENOUS | Status: DC | PRN
  Administered 2018-05-13: 18:00:00 via INTRAVENOUS

## 2018-05-13 MED ORDER — MIDAZOLAM HCL 2 MG/2ML IJ SOLN
INTRAMUSCULAR | Status: AC
  Filled 2018-05-13: qty 2

## 2018-05-13 MED ORDER — PROPOFOL 1000 MG/100ML IV EMUL
5.0000 ug/kg/min | INTRAVENOUS | Status: DC
  Administered 2018-05-13: 80 ug/kg/min via INTRAVENOUS
  Administered 2018-05-13: 75 ug/kg/min via INTRAVENOUS
  Administered 2018-05-14 (×3): 80 ug/kg/min via INTRAVENOUS
  Filled 2018-05-13 (×4): qty 100

## 2018-05-13 MED ORDER — FENTANYL CITRATE (PF) 100 MCG/2ML IJ SOLN
50.0000 ug | INTRAMUSCULAR | Status: DC | PRN
  Administered 2018-05-13 (×2): 100 ug via INTRAVENOUS
  Filled 2018-05-13 (×2): qty 2

## 2018-05-13 MED ORDER — SODIUM BICARBONATE 8.4 % IV SOLN
INTRAVENOUS | Status: DC | PRN
  Administered 2018-05-13 (×2): 50 meq via INTRAVENOUS

## 2018-05-13 MED ORDER — PROPOFOL 10 MG/ML IV BOLUS
INTRAVENOUS | Status: AC
  Filled 2018-05-13: qty 20

## 2018-05-13 MED ORDER — ACETAMINOPHEN 10 MG/ML IV SOLN
1000.0000 mg | Freq: Four times a day (QID) | INTRAVENOUS | Status: AC
  Administered 2018-05-13 – 2018-05-14 (×4): 1000 mg via INTRAVENOUS
  Filled 2018-05-13 (×4): qty 100

## 2018-05-13 MED ORDER — BUPIVACAINE LIPOSOME 1.3 % IJ SUSP
20.0000 mL | Freq: Once | INTRAMUSCULAR | Status: DC
  Filled 2018-05-13: qty 20

## 2018-05-13 MED ORDER — DEXAMETHASONE SODIUM PHOSPHATE 10 MG/ML IJ SOLN
INTRAMUSCULAR | Status: DC | PRN
  Administered 2018-05-13: 10 mg via INTRAVENOUS

## 2018-05-13 MED ORDER — MIDAZOLAM HCL 5 MG/5ML IJ SOLN
INTRAMUSCULAR | Status: DC | PRN
  Administered 2018-05-13 (×2): 1 mg via INTRAVENOUS

## 2018-05-13 MED ORDER — ASPIRIN 81 MG PO CHEW: 324.0000 mg | CHEWABLE_TABLET | ORAL | Status: AC

## 2018-05-13 MED ORDER — PROMETHAZINE HCL 25 MG/ML IJ SOLN
12.5000 mg | Freq: Four times a day (QID) | INTRAMUSCULAR | Status: DC | PRN
  Administered 2018-05-13: 12.5 mg via INTRAVENOUS

## 2018-05-13 MED ORDER — BUPIVACAINE HCL (PF) 0.25 % IJ SOLN
INTRAMUSCULAR | Status: AC
  Filled 2018-05-13: qty 30

## 2018-05-13 MED ORDER — SODIUM CHLORIDE 0.9 % IV SOLN
INTRAVENOUS | Status: DC | PRN
  Administered 2018-05-13: 18:00:00 via INTRAVENOUS

## 2018-05-13 MED ORDER — CHLORHEXIDINE GLUCONATE 0.12% ORAL RINSE (MEDLINE KIT)
15.0000 mL | Freq: Two times a day (BID) | OROMUCOSAL | Status: DC
  Administered 2018-05-13 – 2018-05-16 (×4): 15 mL via OROMUCOSAL

## 2018-05-13 MED ORDER — ETOMIDATE 2 MG/ML IV SOLN
INTRAVENOUS | Status: DC | PRN
  Administered 2018-05-13: 10 mg via INTRAVENOUS

## 2018-05-13 MED ORDER — FAMOTIDINE IN NACL 20-0.9 MG/50ML-% IV SOLN
20.0000 mg | Freq: Two times a day (BID) | INTRAVENOUS | Status: DC
  Administered 2018-05-13 – 2018-05-16 (×6): 20 mg via INTRAVENOUS
  Filled 2018-05-13 (×5): qty 50

## 2018-05-13 SURGICAL SUPPLY — 48 items
ATTRACTOMAT 16X20 MAGNETIC DRP (DRAPES) IMPLANT
CHLORAPREP W/TINT 26ML (MISCELLANEOUS) ×2 IMPLANT
CLIP VESOCCLUDE MED 6/CT (CLIP) ×3 IMPLANT
CLIP VESOCCLUDE MED LG 6/CT (CLIP) IMPLANT
CONT SPEC 4OZ CLIKSEAL STRL BL (MISCELLANEOUS) IMPLANT
COVER SURGICAL LIGHT HANDLE (MISCELLANEOUS) ×2 IMPLANT
DERMABOND ADVANCED (GAUZE/BANDAGES/DRESSINGS)
DERMABOND ADVANCED .7 DNX12 (GAUZE/BANDAGES/DRESSINGS) IMPLANT
DRAPE UTILITY 15X26 (DRAPE) ×2 IMPLANT
DRAPE WARM FLUID 44X44 (DRAPE) ×2 IMPLANT
DRESSING TELFA ISLAND 4X8 (GAUZE/BANDAGES/DRESSINGS) ×2 IMPLANT
DRSG OPSITE POSTOP 4X10 (GAUZE/BANDAGES/DRESSINGS) ×1 IMPLANT
ELECT LIGASURE SHORT 9 REUSE (ELECTRODE) IMPLANT
ELECT REM PT RETURN 9FT ADLT (ELECTROSURGICAL) ×2
ELECTRODE REM PT RTRN 9FT ADLT (ELECTROSURGICAL) ×1 IMPLANT
GAUZE SPONGE 4X4 12PLY STRL (GAUZE/BANDAGES/DRESSINGS) IMPLANT
GAUZE SPONGE 4X4 16PLY XRAY LF (GAUZE/BANDAGES/DRESSINGS) ×1 IMPLANT
GLOVE BIO SURGEON STRL SZ 6 (GLOVE) ×4 IMPLANT
GLOVE BIO SURGEON STRL SZ 6.5 (GLOVE) ×4 IMPLANT
GOWN STRL REUS W/ TWL LRG LVL3 (GOWN DISPOSABLE) ×2 IMPLANT
GOWN STRL REUS W/TWL LRG LVL3 (GOWN DISPOSABLE) ×2
KIT BASIN OR (CUSTOM PROCEDURE TRAY) ×2 IMPLANT
LIGASURE IMPACT 36 18CM CVD LR (INSTRUMENTS) ×1 IMPLANT
LOOP VESSEL MAXI BLUE (MISCELLANEOUS) IMPLANT
NEEDLE HYPO 22GX1.5 SAFETY (NEEDLE) ×4 IMPLANT
NS IRRIG 1000ML POUR BTL (IV SOLUTION) ×8 IMPLANT
PACK GENERAL/GYN (CUSTOM PROCEDURE TRAY) ×2 IMPLANT
SHEET LAVH (DRAPES) ×2 IMPLANT
SPONGE LAP 18X18 X RAY DECT (DISPOSABLE) IMPLANT
STAPLER VISISTAT 35W (STAPLE) IMPLANT
SUT MNCRL AB 4-0 PS2 18 (SUTURE) ×2 IMPLANT
SUT PDS AB 1 TP1 96 (SUTURE) ×4 IMPLANT
SUT VIC AB 0 CT1 18XCR BRD 8 (SUTURE) IMPLANT
SUT VIC AB 0 CT1 27 (SUTURE) ×2
SUT VIC AB 0 CT1 27XBRD ANBCTR (SUTURE) IMPLANT
SUT VIC AB 0 CT1 36 (SUTURE) ×6 IMPLANT
SUT VIC AB 0 CT1 8-18 (SUTURE) ×1
SUT VIC AB 2-0 CT1 36 (SUTURE) ×4 IMPLANT
SUT VIC AB 2-0 CT2 18 VCP726D (SUTURE) ×1 IMPLANT
SUT VIC AB 2-0 CT2 27 (SUTURE) ×12 IMPLANT
SUT VIC AB 3-0 CTX 36 (SUTURE) IMPLANT
SUT VIC AB 3-0 SH 27 (SUTURE) ×3
SUT VIC AB 3-0 SH 27XBRD (SUTURE) IMPLANT
SYR 20CC LL (SYRINGE) ×4 IMPLANT
TOWEL OR 17X26 10 PK STRL BLUE (TOWEL DISPOSABLE) ×2 IMPLANT
TOWEL OR NON WOVEN STRL DISP B (DISPOSABLE) ×2 IMPLANT
TRAY FOLEY MTR SLVR 14FR STAT (SET/KITS/TRAYS/PACK) ×2 IMPLANT
WATER STERILE IRR 1500ML POUR (IV SOLUTION) ×2 IMPLANT

## 2018-05-13 NOTE — Significant Event (Signed)
Rapid Response Event Note  Overview: Time Called: 0658 Arrival Time: 0706 Event Type: Hypotension  Initial Focused Assessment: Nursing staff called regarding hypotensive patient after bolus given. Upon arrival, pt is alert, oriented x4. Pt c/o abdominal pain and nausea. Denies SOB.  Skin is cool, dry and pale.  HR 63, BP 74/62, RR 20 with sats 96% on RA.  Abdomen is distended, tight and tender.  Palpable pulses are weak.  NS bolus infusion currently infusing. Dr. Myna Hidalgo is at bedside and Neo gtt is ordered.  Interventions: -new additional PIV -Neo gtt started at 20 mcg -Transfer to ICU   Event Summary: Pt transferred to 2H09  Madelynn Done

## 2018-05-13 NOTE — Anesthesia Procedure Notes (Signed)
Procedure Name: Intubation Date/Time: 05/13/2018 6:27 PM Performed by: Suzy Bouchard, CRNA Pre-anesthesia Checklist: Patient identified, Emergency Drugs available, Suction available, Patient being monitored and Timeout performed Patient Re-evaluated:Patient Re-evaluated prior to induction Oxygen Delivery Method: Circle system utilized Preoxygenation: Pre-oxygenation with 100% oxygen Induction Type: IV induction, Cricoid Pressure applied and Rapid sequence Laryngoscope Size: Miller and 2 Grade View: Grade I Tube type: Subglottic suction tube Tube size: 7.0 mm Number of attempts: 1 Placement Confirmation: ETT inserted through vocal cords under direct vision,  positive ETCO2 and breath sounds checked- equal and bilateral Secured at: 23 cm Tube secured with: Tape Dental Injury: Teeth and Oropharynx as per pre-operative assessment

## 2018-05-13 NOTE — Op Note (Signed)
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   Procedure(s) Performed: Exploratory laparotomy with total abdominal hysterectomy, bilateral salpingo-oophorectomy, omentectomy.  Surgeon: Thereasa Solo, MD.  Assistant Surgeon: Lahoma Crocker, M.D. Assistant: (an MD assistant was necessary for tissue manipulation, retraction and positioning due to the complexity of the case and hospital policies).   Specimens: Uterus, cervix, bilateral tubes / ovaries, omentum. Washings for cytology.   Estimated Blood Loss: 4000cc hemoperitoneum. 200 mL.    Transfused 5 units PRBC intraop, 2 units FFP, 2 units platelets.   Urine Output: 85IO  Complications: None.   Operative Findings: 4000cc hemoperitoneum, 20cm left ovarian solid mass that was torsed on its vascular pedicle. Normal right tube and ovary. Normal appearing uterus and omentum. Normal diaphragms. No gross extraovarian disease. Normal appendix.   Procedure:  The patient was seen in the Holding Room. The risks, benefits, complications, treatment options, and expected outcomes were discussed with the patient.  The patient concurred with the proposed plan, giving informed consent.   The patient was  identified as Jacqueline Castro  and the procedure verified as ex lap, possible hysterectomy and BSO. A Time Out was held and the above information confirmed upon entry to the operating room..  After induction of anesthesia, the patient was draped and prepped in the usual sterile manner.  She was prepped and draped in the normal sterile fashion in the dorsal lithotomy position in padded Allen stirrups with good attention paid to support of the lower back and lower extremities. Position was adjusted for appropriate support. A Foley catheter was placed to gravity.   A right paramedian incision was made and carried  through the subcutaneous tissue to the fascia. The fascial incision was made and extended superiorally. The rectus muscles were separated. The peritoneum was identified and entered. Peritoneal incision was extended longitudinally.  The abdominal cavity was entered sharply and without incident. A Bookwalter retractor was then placed. A survey of the abdomen and pelvis revealed the above findings, which were significant for a 20cm left ovarian mass that had torsed and ruptured.The uterus and right ovary were normal. There was a massive hemoperitoneum that had predated surgery (approximately 4000cc).  The left ovary was delivered through the incision, cross clamped and sent for frozen section pathology which revealed a necrotic, hemorrhagic mass with possible stromal cells (unable to identify malignancy on frozen section).   After packing the small bowel into the upper abdomen, we performed the right salpingo-oophorectomy by entering the  pelvic sidewall just posterior to the right round ligament. The pararectal space was developed and the retroperitoneum developed up to the level of the common iliac artery.  The course of the ureter was identified with ease. The right IP was then skeletonized, and sealed and transected with ligasure. The ovary was separated from its peritoneal attachments with the bovie with visualization of the ureter at all times.   The sigmoid colon was dissected from its dense tumor attachments to the left ovarian vessels using sharp dissection. The left retroperitoneal peritoneum was entered parallel to the sigmoid colon attachments and the left ureter was identified in the left retroperitoneal space. The residual left IP ligament was sealed with ligasure.  The uterine vessels were clamped bilaterally at the uterine isthmus with ligasure because curved Heaney clamps were not available for use. Kocher clamps were necessary to use for clamping and sealing the cardinal ligaments bilaterally  as haeney clamps were not available to utilize. This was sharply transected and made hemostatic with suture. The bladder was ensured to be well below the cervicovaginal junction. Because no curved clamps were available, the colpotomy was made anteriorally by elevating the vaginal tissues with allis clamps, incising with the bovie, then using the bovie and ligasure to cut and seal the upper vagina from anterior to posterior. The bovie transected the poseterior vagina. The vaginal cuff was closed with 0-vicryl on interrupted figure of 8 suture.  The peritoneal cavity was irrigated and hemostasis was confirmed at all surgical sites after clips and application of floseal.  The omentectomy was then performed. The omentum was delivered into the mid abdomen and elevated. The parietal peritonum that attaches the omentum to the transverse colon was incised facilitating separation of the mid portion of the omentum from the transverse colon. The right sided omentum with its vascular pedicles was then skeletonized in its attachments to the right transverse colon. These vascular pedicles were bipolar sealed and transected. Observation of the colonic wall occurred throughout. The dissection then moved progressively along the colon the the left splenic flexure of the transverse colon. The bipolar sealing forceps and the scissors were used to skeletonize vascular omental pedicles, seal them and transect them until the entire infracolic omentum had been freed from its transverse colonic attachments to the splenic flexure.  There was no residual tumor at the completion of the procedure.  The fascia was reapproximated with #1 looped PDS using a total of two sutures. The subcutaneous layer was then irrigated copiously.  Exparel long acting local anesthetic was infiltrated into the subcutaneous tissues. The skin was closed with staples. The patient tolerated the procedure well.   Sponge, lap and needle counts were correct x 2.    Thereasa Solo, MD

## 2018-05-13 NOTE — Anesthesia Postprocedure Evaluation (Signed)
Anesthesia Post Note  Patient: Jacqueline Castro  Procedure(s) Performed: EXPLORATORY LAPAROTOMY, TOTAL ABDOMINAL HYSTERECTOMY, BILATERAL SALPINGO-OOPHORECTOMY, OMENTECTOMY (N/A Abdomen)     Patient location during evaluation: ICU Anesthesia Type: General Level of consciousness: patient remains intubated per anesthesia plan Vital Signs Assessment: post-procedure vital signs reviewed and stable Respiratory status: patient remains intubated per anesthesia plan Cardiovascular status: stable Anesthetic complications: no    Last Vitals:  Vitals:   05/13/18 1700 05/13/18 1714  BP: (!) 112/49 (!) 109/58  Pulse: (!) 112 (!) 112  Resp: 15 19  Temp:  36.8 C  SpO2: 93% 97%    Last Pain:  Vitals:   05/13/18 1714  TempSrc: Oral  PainSc:                  Maximos Zayas

## 2018-05-13 NOTE — Progress Notes (Signed)
6629- Rapid Response team at bedside.  Blood pressure 82/53.

## 2018-05-13 NOTE — Procedures (Signed)
The procedure was described to the patient and consent signed by the daughter.  Procedure: Insertion of right subclavian vein triple lumen catheter Indication: Hypovolemic shock  Description: The right supra-and infraclavicular area was prepped in a sterile manner. After local lidocaine anesthesia, the right subclavian vein was accessed inferior to the clavicle using the Seldinger technique. The guide wire was threaded through the needle and the latter remove. After enlarging access with a nick from a surgical blade, the vessel dilator was threaded over the guidewire, and the former removed. The triple lumen catheter was then threaded over the guidewire and the wire was removed. Following the flushing of all ports, the catheter was secured with sutures. The patient tolerated the procedure well. Post-procedure PCXR was without pneumothorax and the tip in the distal right atrium. No parenchymal infiltrates were present.

## 2018-05-13 NOTE — Consult Note (Signed)
Consultation  Referring Provider:   Jamesetta So   Primary Care Physician:  [name], MD Primary Gastroenterologist:        Dr. [name] Plan Reason for Consultation:    Shock, rule out GI bleeding         HPI:   Jacqueline Castro is a 57 y.o. female with a history of ulcerative colitis, who presented to the hospital yesterday with LLQ abdominal pain. Daughter at bedside during interview. Pain ongoing for 2 months or so, but became worse and rated 10/10. CT scan yesterday showed a large pelvic mass with likely malignant ascites. CEA and CA 125 pending. Gyn / Onc consulted.  This morning she became hypotensive to 65/52. Labs showed her Hgb dropped from 13.8 to 6.9 in less than 24 hours. We were consulted to rule out GI bleed. She had some vomiting prior to admission which was clear emesis per daughter. Her last bowel movement was 2 days ago, she's had some constipation recently. She denies any melena or passage of any blood per rectum or in her vomit. She endorses progressive worsening of her abdominal pain with worsening distension. PRBC transfusion going and on pressors currently.  Past Medical History:  Diagnosis Date  . Anemia   . Anxiety   . Chest pain   . Palpitations   . Proctitis   . Ulcerative colitis     Past Surgical History:  Procedure Laterality Date  . CESAREAN SECTION    . LUMBAR DISC SURGERY     Neck  . TUBAL LIGATION      Family History  Problem Relation Age of Onset  . Diabetes Mother   . Colon cancer Paternal Grandfather 63  . Colon cancer Paternal Uncle 41  . Heart Problems Sister        bicuspid aortic valve     Social History   Tobacco Use  . Smoking status: Never Smoker  . Smokeless tobacco: Never Used  Substance Use Topics  . Alcohol use: No    Comment: occasionally  . Drug use: No    Prior to Admission medications   Medication Sig Start Date End Date Taking? Authorizing Provider  ALPRAZolam Duanne Moron) 0.5 MG tablet Take 0.5 mg by mouth 3  (three) times daily as needed for anxiety.  03/26/14  Yes [provider]  DULoxetine (CYMBALTA) 30 MG capsule Take 120 mg by mouth daily.    Yes [provider]  traZODone (DESYREL) 50 MG tablet Take 100 mg by mouth at bedtime.   Yes [provider]  pantoprazole (PROTONIX) 20 MG tablet Take 1 tablet (20 mg total) by mouth daily. Patient not taking: Reported on 05/12/2018 04/19/16   [name], MD    Current Facility-Administered Medications  Medication Dose Route Frequency Provider Last Rate Last Dose  . 0.9 %  sodium chloride infusion (Manually program via Guardrails IV Fluids)   Intravenous Once Opyd, Ilene Qua, MD      . 0.9 %  sodium chloride infusion  250 mL Intravenous PRN Crim, Courtney, MD      . ALPRAZolam Duanne Moron) tablet 0.5 mg  0.5 mg Oral TID PRN Gilles Chiquito B, MD   0.5 mg at 05/13/18 0424  . aspirin chewable tablet 324 mg  324 mg Oral NOW Crim, Courtney, MD       Or  . aspirin suppository 300 mg  300 mg Rectal NOW Crim, Courtney, MD      . docusate sodium (COLACE) capsule 100 mg  100 mg Oral BID Gilles Chiquito B, MD   100 mg at 05/12/18 2118  . DULoxetine (CYMBALTA) DR capsule 120 mg  120 mg Oral Daily Gilles Chiquito B, MD   120 mg at 05/12/18 1914  . famotidine (PEPCID) IVPB 20 mg premix  20 mg Intravenous Q12H Crim, Courtney, MD      . HYDROmorphone (DILAUDID) injection 1 mg  1 mg Intravenous Q2H PRN Sid Falcon, MD   1 mg at 05/13/18 1019  . ondansetron (ZOFRAN) tablet 4 mg  4 mg Oral Q6H PRN Sid Falcon, MD       Or  . ondansetron [facility]) injection 4 mg  4 mg Intravenous Q6H PRN Sid Falcon, MD   4 mg at 05/13/18 0010  . oxyCODONE (Oxy IR/ROXICODONE) immediate release tablet 5 mg  5 mg Oral Q3H PRN Sid Falcon, MD   5 mg at 05/12/18 2059  . phenylephrine (NEOSYNEPHRINE) 10-0.9 MG/250ML-% infusion  0-400 mcg/min Intravenous Titrated Opyd, Ilene Qua, MD 150 mL/hr at 05/13/18 1000 100 mcg/min at 05/13/18 1000  . promethazine  (PHENERGAN) injection 12.5 mg  12.5 mg Intravenous Q6H PRN Blount, Xenia T, NP   12.5 mg at 05/13/18 0130  . senna (SENOKOT) tablet 8.6 mg  1 tablet Oral BID Gilles Chiquito B, MD   8.6 mg at 05/12/18 2119  . sodium chloride 0.9 % bolus 1,000 mL  1,000 mL Intravenous Once Opyd, Ilene Qua, MD      . traZODone (DESYREL) tablet 100 mg  100 mg Oral QHS Gilles Chiquito B, MD   100 mg at 05/12/18 2119    Allergies as of 05/12/2018 - Review Complete 05/12/2018  Allergen Reaction Noted  . Codeine  04/15/2010     Review of Systems:    As per HPI, otherwise negative    Physical Exam:  Vital signs in last 24 hours: Temp:  [97.7 F (36.5 C)-98.6 F (37 C)] 98.6 F (37 C) (08/03 1000) Pulse Rate:  [63-110] 101 (08/03 1000) Resp:  [12-27] 12 (08/03 1000) BP: (65-155)/(35-93) 110/51 (08/03 1000) SpO2:  [96 %-100 %] 100 % (08/03 1000) FiO2 (%):  [100 %] 100 % (08/03 0900) Weight:  [139 lb 11.2 oz (63.4 kg)] 139 lb 11.2 oz (63.4 kg) (08/02 1530) Last BM Date: 05/11/18 General:   Pleasant female, wearing BiPAP Head:  Normocephalic and atraumatic. Eyes:   No icterus.   Ears:  Normal auditory acuity. Neck:  Supple Lungs:  Respirations even and unlabored. Lungs clear to auscultation bilaterally.    Heart:  tachycardic; no MRG Abdomen:  Diffusely tender with ascites, distended. . Rectal:  No stool or blood in the rectal vault  Msk:  Symmetrical without gross deformities.  Extremities:  Without edema. Neurologic:  Alert and  oriented x4;  grossly normal neurologically. Skin:  Intact without significant lesions or rashes. Psych:  Alert and cooperative. Normal affect.  LAB RESULTS: Recent Labs    05/12/18 0521 05/13/18 0544  WBC 5.9 15.4*  HGB 13.8 6.9*  HCT 41.0 21.3*  PLT 221 197   BMET Recent Labs    05/12/18 0521 05/13/18 0544  NA 140 132*  K 3.9 4.5  CL 104 99  CO2 24 22  GLUCOSE 148* 189*  BUN 12 20  CREATININE 0.73 1.75*  CALCIUM 9.3 8.3*   LFT Recent Labs     05/13/18 0544  PROT 4.9*  ALBUMIN 3.2*  AST 43*  ALT 14  ALKPHOS 29*  BILITOT 0.5  PT/INR No results for input(s): LABPROT, INR in the last 72 hours.  STUDIES: Ct Abdomen Pelvis W Contrast  Result Date: 05/12/2018 CLINICAL DATA:  Left lower quadrant abdominal pain beginning a few hours ago. EXAM: CT ABDOMEN AND PELVIS WITH CONTRAST TECHNIQUE: Multidetector CT imaging of the abdomen and pelvis was performed using the standard protocol following bolus administration of intravenous contrast. CONTRAST:  170m OMNIPAQUE IOHEXOL 300 MG/ML  SOLN COMPARISON:  None. FINDINGS: Lower chest: Normal Hepatobiliary: Benign 9 mm cyst at the dome of the liver. No other liver parenchymal finding. No calcified gallstones. Pancreas: Normal Spleen: Normal Adrenals/Urinary Tract: Adrenal glands are normal. Right kidney is normal. Left kidney is normal except for a 2.6 cm cyst. The bladder is intrinsically normal. Stomach/Bowel: No primary bowel finding. Vascular/Lymphatic: Normal Reproductive: Complex pelvic mass measuring 19 x 12 x 13 cm likely of ovarian origin. Ascites, likely malignant. I do not see any obvious distant nodular components. The uterus itself is normal. Other: No free air. 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 No evidence of bowel pathology or other organ pathology. Electronically Signed   By: MNelson ChimesM.D.   On: 05/12/2018 07:48   Dg Chest Port 1 View  Result Date: 05/13/2018 CLINICAL DATA:  Central line placement EXAM: PORTABLE CHEST 1 VIEW COMPARISON:  Chest x-ray dated 04/19/2016. FINDINGS: Study is hypoinspiratory. Given the low lung volumes, lungs appear clear. Heart size appears stable. RIGHT subclavian central line appears adequately positioned with tip overlying the RIGHT atrium. No pneumothorax seen. IMPRESSION: 1. RIGHT subclavian central line adequately position with tip  overlying the RIGHT atrium. 2. No pneumothorax. 3. Low lung volumes. Electronically Signed   By: SFranki CabotM.D.   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   This morning she became hypotensive with worsening pain, noted to have a severe drop in her Hgb from baseline. She has no blood per rectum, no vomiting blood. Her BUN is normal. Her DRE shows no blood or stool in the vault.  If this patient were GI bleeding to lose half of her blood volume and develop shock, we should see it come out somewhere. Based on her presentation I do not think she is bleeding from her bowel. More likely, she is be bleeding from her ovarian mass into her peritoneum given worsening pain and distension.  I would contact Gyn / Onc for their input and recommend CT angiography if her kidney function can tolerate it to further evaluate and clarify where she is bleeding and in case this is amenable to IR embolization.. If the mass if bleeding not sure if this would be a candidate for IR embolization or require surgery.   If she develops overt GI blood loss otherwise please let me know.   SCarolina Cellar MD LEv[facility] CenterGastroenterology

## 2018-05-13 NOTE — Progress Notes (Signed)
0511- Informed Floor provider Blount that manual blood pressure is 80/60.  I liter of  Normal Saline Bolus given.  Will recheck blood pressure once infusion complete.  Pt. Remains cold, skin cold to touch and pale.  Pt. Reports alert and drowsy.  Pt.  Able to drink water without any difficulty from a straw.  While infusing going IV infiltrated.  IV team called stat to bedside to start another IV.  IV obtained by IV team in Right AC.  NS bolus continued in new IV site.   Monitoring closely.

## 2018-05-13 NOTE — Progress Notes (Signed)
RT NOTES: Patient received on floor from 6N. MD gives order to place patient on CPAP @5  100%.

## 2018-05-13 NOTE — Transfer of Care (Signed)
Immediate Anesthesia Transfer of Care Note  Patient: Jacqueline Castro  Procedure(s) Performed: EXPLORATORY LAPAROTOMY, TOTAL ABDOMINAL HYSTERECTOMY, BILATERAL SALPINGO-OOPHORECTOMY, OMENTECTOMY (N/A Abdomen)  Patient Location: ICU  Anesthesia Type:General  Level of Consciousness: sedated and Patient remains intubated per anesthesia plan  Airway & Oxygen Therapy: Patient remains intubated per anesthesia plan and Patient placed on Ventilator (see vital sign flow sheet for setting)  Post-op Assessment: Report given to RN and Post -op Vital signs reviewed and stable  Post vital signs: Reviewed and stable  Last Vitals:  Vitals Value Taken Time  BP    Temp    Pulse 94 05/13/2018  9:26 PM  Resp 28 05/13/2018  9:26 PM  SpO2 100 % 05/13/2018  9:26 PM  Vitals shown include unvalidated device data.  Last Pain:  Vitals:   05/13/18 1714  TempSrc: Oral  PainSc:       Patients Stated Pain Goal: 0 (61/60/73 7106)  Complications: No apparent anesthesia complications

## 2018-05-13 NOTE — Progress Notes (Signed)
[place] Progress Note Patient Name: Jacqueline Castro DOB: Jul 04, 1961 MRN: [id number]   Date of Service  05/13/2018  HPI/Events of Note  ABG on 100%/PRVC 15/TV 500/P 5 = 7.247/57.4/213.0  eICU Interventions  Will order:  1. Increase PRVC rate to 20.  2. Repeat ABG at 11:30 PM.      Intervention Category Major Interventions: Acid-Base disturbance - evaluation and management;Respiratory failure - evaluation and management  Ratasha Fabre Eugene 05/13/2018, 10:10 PM

## 2018-05-13 NOTE — H&P (View-Only) (Signed)
Reason for Consult: Acute bleed/hypovolemic shock Referring Physician: Jamesetta So, MD  Jacqueline Castro is an 57 y.o. female.  HPI: Asked to consult on this patient with a newly diagnosed, large pelvic mass and an acute bleeding episode.  She presented to the ED yesterday with LLQ abdominal pain.  Onset of the pain was 2 months ago.  The pain has been progressive in intensity.  She has had dyspepsia, bloating and increasing abdominal girth.  She reports minimal changes in her bowel/bladder habits.  She has a h/o ulcerative colitis but this has been quiescent recently.  She denies any weight loss.  There is no significant family h/o malignancies. She has had a screening colonoscopy.  A Pap smear 1 yr ago was negative.   A CT scan in the ED yesterday revealed a 19 cm abdominal/pelvic mass with ascites.  A CA-125 was 132 yesterday; a CEA was in range.  Earlier today she became hypotensive.  A hemoglobin had downward trended to 6.9.  A CT angio of the abdomen/pelvis showed a focal area of rupture with extravasation of blood/hemoperitoneum from the mass; felt to be a venous bleed.  There was also a suggestion of an intratumoral bleed.    Past Medical History:  Diagnosis Date  . Anemia   . Anxiety   . Chest pain   . Palpitations   . Proctitis   . Ulcerative colitis     Past Surgical History:  Procedure Laterality Date  . CESAREAN SECTION    . LUMBAR DISC SURGERY     Neck  . TUBAL LIGATION      Family History  Problem Relation Age of Onset  . Diabetes Mother   . Colon cancer Paternal Grandfather 58  . Colon cancer Paternal Uncle 64  . Heart Problems Sister        bicuspid aortic valve    Social History:  reports that she has never smoked. She has never used smokeless tobacco. She reports that she does not drink alcohol or use drugs.  Allergies:  Allergies  Allergen Reactions  . Codeine     REACTION: vomiting    Medications: I have reviewed the patient's current  medications.  Results for orders placed or performed during the hospital encounter of 05/12/18 (from the past 48 hour(s))  Lipase, blood     Status: None   Collection Time: 05/12/18  5:21 AM  Result Value Ref Range   Lipase 39 11 - 51 U/L    Comment: Performed at [facility], [address] [place], [place] [address]  Comprehensive metabolic panel     Status: Abnormal   Collection Time: 05/12/18  5:21 AM  Result Value Ref Range   Sodium 140 135 - 145 mmol/L   Potassium 3.9 3.5 - 5.1 mmol/L   Chloride 104 98 - 111 mmol/L   CO2 24 22 - 32 mmol/L   Glucose, Bld 148 (H) 70 - 99 mg/dL   BUN 12 6 - 20 mg/dL   Creatinine, Ser 0.73 0.44 - 1.00 mg/dL   Calcium 9.3 8.9 - 10.3 mg/dL   Total Protein 6.3 (L) 6.5 - 8.1 g/dL   Albumin 3.8 3.5 - 5.0 g/dL   AST 43 (H) 15 - 41 U/L   ALT 17 0 - 44 U/L   Alkaline Phosphatase 41 38 - 126 U/L   Total Bilirubin 0.7 0.3 - 1.2 mg/dL   GFR calc non Af Amer >60 >60 mL/min   GFR calc Af Amer >  60 >60 mL/min    Comment: (NOTE) The eGFR has been calculated using the CKD EPI equation. This calculation has not been validated in all clinical situations. eGFR's persistently <60 mL/min signify possible Chronic Kidney Disease.    Anion gap 12 5 - 15    Comment: Performed at [facility] [address], [place] [address]  CBC     Status: None   Collection Time: 05/12/18  5:21 AM  Result Value Ref Range   WBC 5.9 4.0 - 10.5 K/uL   RBC 4.26 3.87 - 5.11 MIL/uL   Hemoglobin 13.8 12.0 - 15.0 g/dL   HCT 41.0 36.0 - 46.0 %   MCV 96.2 78.0 - 100.0 fL   MCH 32.4 26.0 - 34.0 pg   MCHC 33.7 30.0 - 36.0 g/dL   RDW 12.7 11.5 - 15.5 %   Platelets 221 150 - 400 K/uL    Comment: Performed at [facility], [place] [address], [place], [place] [address]  CA 125     Status: Abnormal   Collection Time: 05/12/18  8:25 AM  Result Value Ref Range   Cancer Antigen (CA) 125 132.0 (H) 0.0 - 38.1 U/mL    Comment: (NOTE) [employer]  Electrochemiluminescence Immunoassay (ECLIA) Values obtained with different assay methods or kits cannot be used interchangeably.  Results cannot be interpreted as absolute evidence of the presence or absence of malignant disease. Performed At: [facility], [place] [id number] Rush Farmer MD TO:[id number]   CEA     Status: None   Collection Time: 05/12/18  8:25 AM  Result Value Ref Range   CEA 2.0 0.0 - 4.7 ng/mL    Comment: (NOTE)                             Nonsmokers          <3.9                             Smokers             <5.6 [employer] Electrochemiluminescence Immunoassay (ECLIA) Values obtained with different assay methods or kits cannot be used interchangeably.  Results cannot be interpreted as absolute evidence of the presence or absence of malignant disease. Performed At: [facility] [place], [place] [id number] Rush Farmer MD ZJ:[id number]   Glucose, capillary     Status: Abnormal   Collection Time: 05/13/18  5:22 AM  Result Value Ref Range   Glucose-Capillary 165 (H) 70 - 99 mg/dL  CBC with Differential/Platelet     Status: Abnormal   Collection Time: 05/13/18  5:44 AM  Result Value Ref Range   WBC 15.4 (H) 4.0 - 10.5 K/uL   RBC 2.14 (L) 3.87 - 5.11 MIL/uL   Hemoglobin 6.9 (LL) 12.0 - 15.0 g/dL    Comment: REPEATED TO VERIFY CRITICAL RESULT CALLED TO, READ BACK BY AND VERIFIED WITHJarold Motto RN [phone] 05/13/18 HMILES    HCT 21.3 (L) 36.0 - 46.0 %   MCV 99.5 78.0 - 100.0 fL   MCH 32.2 26.0 - 34.0 pg   MCHC 32.4 30.0 - 36.0 g/dL   RDW 12.9 11.5 - 15.5 %   Platelets 197 150 - 400 K/uL   Neutrophils Relative % 80 %   Neutro Abs 12.4 (H) 1.7 - 7.7 K/uL   Lymphocytes  Relative 13 %   Lymphs Abs 2.0 0.7 - 4.0 K/uL   Monocytes Relative 6 %   Monocytes Absolute 0.9 0.1 - 1.0 K/uL   Eosinophils Relative 0 %   Eosinophils Absolute 0.0 0.0 - 0.7 K/uL   Basophils Relative 0 %   Basophils  Absolute 0.0 0.0 - 0.1 K/uL   Immature Granulocytes 1 %   Abs Immature Granulocytes 0.1 0.0 - 0.1 K/uL    Comment: Performed at [facility], [place] [address] [place], [place] [address]  Comprehensive metabolic panel     Status: Abnormal   Collection Time: 05/13/18  5:44 AM  Result Value Ref Range   Sodium 132 (L) 135 - 145 mmol/L    Comment: DELTA CHECK NOTED   Potassium 4.5 3.5 - 5.1 mmol/L   Chloride 99 98 - 111 mmol/L   CO2 22 22 - 32 mmol/L   Glucose, Bld 189 (H) 70 - 99 mg/dL   BUN 20 6 - 20 mg/dL   Creatinine, Ser 1.75 (H) 0.44 - 1.00 mg/dL    Comment: DELTA CHECK NOTED   Calcium 8.3 (L) 8.9 - 10.3 mg/dL   Total Protein 4.9 (L) 6.5 - 8.1 g/dL   Albumin 3.2 (L) 3.5 - 5.0 g/dL   AST 43 (H) 15 - 41 U/L   ALT 14 0 - 44 U/L   Alkaline Phosphatase 29 (L) 38 - 126 U/L   Total Bilirubin 0.5 0.3 - 1.2 mg/dL   GFR calc non Af Amer 31 (L) >60 mL/min   GFR calc Af Amer 36 (L) >60 mL/min    Comment: (NOTE) The eGFR has been calculated using the CKD EPI equation. This calculation has not been validated in all clinical situations. eGFR's persistently <60 mL/min signify possible Chronic Kidney Disease.    Anion gap 11 5 - 15    Comment: Performed at [facility] [address], [place], [place] [address]  Type and screen [place]     Status: None (Preliminary result)   Collection Time: 05/13/18  6:59 AM  Result Value Ref Range   ABO/RH(D) PENDING    Antibody Screen PENDING    Sample Expiration 05/16/2018    Unit Number [id number]    Blood Component Type RED CELLS,LR    Unit division 00    Status of Unit ISSUED    Unit tag comment VERBAL ORDERS PER DR TIM OBYD    Transfusion Status OK TO TRANSFUSE    Crossmatch Result PENDING    Unit Number [id number]    Blood Component Type RED CELLS,LR    Unit division 00    Status of Unit ISSUED    Unit tag comment VERBAL ORDERS PER DR TIM OBYD    Transfusion Status      OK TO TRANSFUSE Performed at  [facility], [place] [address] [place], [place] [address]    Crossmatch Result PENDING   MRSA PCR Screening     Status: None   Collection Time: 05/13/18  8:29 AM  Result Value Ref Range   MRSA by PCR NEGATIVE NEGATIVE    Comment:        The GeneXpert MRSA Assay (FDA approved for NASAL specimens only), is one component of a comprehensive MRSA colonization surveillance program. It is not intended to diagnose MRSA infection nor to guide or monitor treatment for MRSA infections. Performed at [facility], [place] [address] [place], [place] [address]  Ct Abdomen Pelvis W Contrast  Result Date: 05/12/2018 CLINICAL DATA:  Left lower quadrant abdominal pain beginning a few hours ago. EXAM: CT ABDOMEN AND PELVIS WITH CONTRAST TECHNIQUE: Multidetector CT imaging of the abdomen and pelvis was performed using the standard protocol following bolus administration of intravenous contrast. CONTRAST:  143m OMNIPAQUE IOHEXOL 300 MG/ML  SOLN COMPARISON:  None. FINDINGS: Lower chest: Normal Hepatobiliary: Benign 9 mm cyst at the dome of the liver. No other liver parenchymal finding. No calcified gallstones. Pancreas: Normal Spleen: Normal Adrenals/Urinary Tract: Adrenal glands are normal. Right kidney is normal. Left kidney is normal except for a 2.6 cm cyst. The bladder is intrinsically normal. Stomach/Bowel: No primary bowel finding. Vascular/Lymphatic: Normal Reproductive: Complex pelvic mass measuring 19 x 12 x 13 cm likely of ovarian origin. Ascites, likely malignant. I do not see any obvious distant nodular components. The uterus itself is normal. Other: No free air. 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 No evidence of bowel pathology or other organ pathology. Electronically Signed   By: MNelson ChimesM.D.   On: 05/12/2018 07:48   Dg Chest Port 1 View  Result Date:  05/13/2018 CLINICAL DATA:  Central line placement EXAM: PORTABLE CHEST 1 VIEW COMPARISON:  Chest x-ray dated 04/19/2016. FINDINGS: Study is hypoinspiratory. Given the low lung volumes, lungs appear clear. Heart size appears stable. RIGHT subclavian central line appears adequately positioned with tip overlying the RIGHT atrium. No pneumothorax seen. IMPRESSION: 1. RIGHT subclavian central line adequately position with tip overlying the RIGHT atrium. 2. No pneumothorax. 3. Low lung volumes. Electronically Signed   By: SFranki CabotM.D.   On: 05/13/2018 09:15   Ct Angio Abd/pel W/ And/or W/o  Result Date: 05/13/2018 CLINICAL DATA:  57year old female with acute onset anemia, increasing abdominal pain and distension and a recently diagnosed massive suspected ovarian neoplasm. Evaluate for evidence of active bleeding. EXAM: CT ANGIOGRAPHY ABDOMEN AND PELVIS WITH CONTRAST AND WITHOUT CONTRAST TECHNIQUE: Multidetector CT imaging of the abdomen and pelvis was performed using the standard protocol during bolus administration of intravenous contrast. Multiplanar reconstructed images and MIPs were obtained and reviewed to evaluate the vascular anatomy. CONTRAST:  826mISOVUE-370 IOPAMIDOL (ISOVUE-370) INJECTION 76% COMPARISON:  CT scan of the abdomen and pelvis with intravenous contrast 05/12/2018 FINDINGS: VASCULAR Aorta: Normal caliber aorta without aneurysm, dissection, vasculitis or significant stenosis. Celiac: Patent without evidence of aneurysm, dissection, vasculitis or significant stenosis. SMA: Patent without evidence of aneurysm, dissection, vasculitis or significant stenosis. Renals: Both renal arteries are patent without evidence of aneurysm, dissection, vasculitis, fibromuscular dysplasia or significant stenosis. IMA: Patent without evidence of aneurysm, dissection, vasculitis or significant stenosis. Inflow: Patent without evidence of aneurysm, dissection, vasculitis or significant stenosis. Proximal Outflow:  Bilateral common femoral and visualized portions of the superficial and profunda femoral arteries are patent without evidence of aneurysm, dissection, vasculitis or significant stenosis. Veins: No evidence of venous thrombosis or occlusion on the delayed phase images. Widely patent hepatic, portal, visceral and pelvic veins. The IVC is patent. Review of the MIP images confirms the above findings. NON-VASCULAR Lower chest: Small right pleural effusion with associated right lower lobe atelectasis. The visualized cardiac structures are within normal limits for size. Trace pericardial effusion. Moderate hiatal hernia. Hepatobiliary: Normal hepatic contour and morphology. No discrete hepatic lesions. High attenuation material within the gallbladder lumen consistent with vicarious excretion of intravenous contrast from yesterday's CT scan. No intra or extrahepatic  biliary ductal dilatation. Pancreas: Unremarkable. No pancreatic ductal dilatation or surrounding inflammatory changes. Spleen: Normal in size without focal abnormality. Adrenals/Urinary Tract: Adrenal glands are unremarkable. Kidneys are normal, without renal calculi, focal solid lesion, or hydronephrosis. 2.6 cm simple cyst in the interpolar left kidney again noted. Bladder is unremarkable. Stomach/Bowel: Stomach is within normal limits. Appendix appears normal. No evidence of bowel wall thickening, distention, or inflammatory changes. Lymphatic: No suspicious lymphadenopathy. Reproductive: Large heterogeneous mass in the low anterior abdomen presumably related to the right adnexa measures slightly larger at 21.3 by 10.9 compared to 19 x 8.9 cm. There is heterogeneous high attenuation and low attenuation in the center of the mass. The majority of the mass is relatively well and capsulated save for the right inferior margin which is irregular and appears to communicate with an adjacent pocket of intermediate attenuation ascites. Overall, findings are highly  concerning for a hemorrhagic and rupturing ovarian carcinoma. Other: Intermediate attenuation ascites has increased since the recent prior examination concerning for intraperitoneal hemorrhage. No evidence of active extravasation. The arterial structures are diffusely low in caliber consistent with shock/pressor usage. Musculoskeletal: No acute fracture or aggressive appearing lytic or blastic osseous lesion. IMPRESSION: VASCULAR 1. Diffuse arterial spasm consistent with the clinical history of shock and intravenous pressor usage. 2. No evidence of active arterial bleeding or other acute vascular abnormality. NON-VASCULAR 1. CT findings are concerning for a hemorrhaging/rupturing ovarian carcinoma affiliated with the right adnexum. The mass appears to have enlarged in the short interval since yesterday's examination suggesting internal hemorrhage. Additionally, the right and right inferolateral margins of the mass are diffusely irregular concerning with an area of rupture. Finally, there has been a significant interval increase in the volume of intermediate attenuation ascites likely reflecting dilute hemoperitoneum. 2. Developing small right pleural effusion, likely reactive. 3. Vicarious excretion of contrast material into the gallbladder lumen. Critical Value/emergent results were called by telephone at the time of interpretation on 05/13/2018 at 12:35 pm to Dr. Lahoma Crocker, who verbally acknowledged these results. Signed, Criselda Peaches, MD [facility] Radiology Electronically Signed   By: Jacqulynn Cadet M.D.   On: 05/13/2018 12:43    Review of Systems  Constitutional: Negative for chills and fever.  Cardiovascular: Negative for chest pain.  Gastrointestinal: Positive for abdominal pain.       LLQ   Blood pressure 121/65, pulse 91, temperature 98.6 F (37 C), temperature source Oral, resp. rate (!) 26, height 5' 4"  (1.626 m), weight 139 lb  11.2 oz (63.4 kg), last menstrual period 08/29/2011, SpO2 100 %. Physical Exam  Constitutional: She is oriented to person, place, and time. She appears well-developed and well-nourished. She appears distressed.  Mild distress  HENT:  Head: Normocephalic and atraumatic.  Neck: Neck supple.  Cardiovascular: Regular rhythm.  GI: She exhibits distension.  Neurological: She is alert and oriented to person, place, and time.    Assessment/Plan: Large, abdominopelvic mass likely ovarian in origin.  Radiologic findings worrisome for a malignancy. Now in hypovolemic shock 2/2 possible intratumor bleed and/or rupture  >serial labs >to OR when hgb in the 10 range and no obvious coagulopathy.  A limited procedure is planned primarily to establish hemostasis. >informed consent was obtained for an exploratory laparotomy, possible salpingo-oophorectomy, possible hysterectomy.  The risks were reviewed including but not limited to VTE, hemorrhage, infection and injury to abdominal/pelvic viscera.  Questions were answered to the patient's stated satisfaction. Lahoma Crocker 05/13/2018, 12:51 PM

## 2018-05-13 NOTE — Progress Notes (Signed)
0530- Pt. Reports that she is nausea.  I have informed  Floor coverage provider Blount of concern and orders given.    4462-  Zosyn 48m given for nausea.  NS bolus continues to infuse.  Will continue to monitor closely.

## 2018-05-13 NOTE — Anesthesia Preprocedure Evaluation (Addendum)
Anesthesia Evaluation  Patient identified by MRN, date of birth, ID band Patient awake    Reviewed: Allergy & Precautions, NPO status , Patient's Chart, lab work & pertinent test results  Airway Mallampati: II  TM Distance: >3 FB     Dental  (+) Teeth Intact, Dental Advisory Given   Pulmonary neg pulmonary ROS,    breath sounds clear to auscultation       Cardiovascular negative cardio ROS   Rhythm:Regular Rate:Normal     Neuro/Psych    GI/Hepatic PUD,   Endo/Other    Renal/GU negative Renal ROS     Musculoskeletal   Abdominal   Peds  Hematology  (+) anemia ,   Anesthesia Other Findings   Reproductive/Obstetrics                           Anesthesia Physical Anesthesia Plan  ASA: III  Anesthesia Plan: General   Post-op Pain Management:    Induction: Intravenous  PONV Risk Score and Plan: Ondansetron, Treatment may vary due to age or medical condition, Dexamethasone and Midazolam  Airway Management Planned: Oral ETT  Additional Equipment:   Intra-op Plan:   Post-operative Plan: Possible Post-op intubation/ventilation  Informed Consent:   Plan Discussed with: Anesthesiologist and CRNA  Anesthesia Plan Comments:         Anesthesia Quick Evaluation

## 2018-05-13 NOTE — Anesthesia Procedure Notes (Signed)
Arterial Line Insertion Start/End8/12/2017 6:00 PM, 05/13/2018 6:02 PM Performed by: Clearnce Sorrel, CRNA  Preanesthetic checklist: patient identified, IV checked, site marked, risks and benefits discussed, surgical consent, monitors and equipment checked, pre-op evaluation and timeout performed radial was placed Catheter size: 20 G Hand hygiene performed  and maximum sterile barriers used  Allen's test indicative of satisfactory collateral circulation Attempts: 2 Procedure performed without using ultrasound guided technique. Following insertion, dressing applied and Biopatch. Post procedure assessment: normal

## 2018-05-13 NOTE — Progress Notes (Signed)
Dr. Rennis Harding at bedside.

## 2018-05-13 NOTE — Progress Notes (Signed)
eLink Physician-Brief Progress Note Patient Name: LERAE LANGHAM DOB: 1961/08/03 MRN: [id number]   Date of Service  05/13/2018  HPI/Events of Note  Patient returns from OR intubated and ventilated. Propofol IV infusion for sedation. Already has orders for SCD's. I see no post intubation CXR in Epic.  eICU Interventions  Will order: 1. Propofol IV infusion. Titrate to RASS = 0 to -1. 2. Fentanyl 50 - 100 mcg IV Q 2 hours PRN pain.  3. Ventilator orders: 100%/PRVC 15/TV 500/P 5.  4. Portable CXR now.  5. ABG STAT. 6. Pepcid 20 mg IV Q 12 hours.  7. Monitor CVP now and Q 4 hours.      Intervention Category Major Interventions: Respiratory failure - evaluation and management  Sommer,Steven Eugene 05/13/2018, 9:35 PM

## 2018-05-13 NOTE — Progress Notes (Signed)
0730- Patient transferred to 2 Heart via Rapid Response Nurse and [place].  Pt's daughter Roselyn Reef at bedside. Roselyn Reef updated on status of her mother.    Dr. Rennis Harding , [place] Physician has also came to bedside to assess patient.

## 2018-05-13 NOTE — Progress Notes (Signed)
0449-  Nurse tech reports that blood pressure is 65/52.  Floor coverage provider Blount called and informed orders given.  Pt. Reports that she is cold and does not feel well.  Pt. Remains alert, but very drowsy.

## 2018-05-13 NOTE — Progress Notes (Signed)
0301- NS bolus completed.  Blood pressure recheck and is now 74/52.  Floor coverage provider called.  New orders given.  PA/NP Blount reports that she has asked Dr. Olevia Bowens to come and see patient.  Another liter of NS started at this time.  Nurse at bedside.

## 2018-05-13 NOTE — Progress Notes (Signed)
6088- Rapid Response team at bedside.  Neosynephrine Drip started.  Rapid Response Team managing drip.  I have called report to Benjaman Lobe staff nurse on 2 Heart unit.

## 2018-05-13 NOTE — Consult Note (Signed)
Reason for Consult: Acute bleed/hypovolemic shock Referring Physician: Jamesetta So, MD  Jacqueline Castro is an 57 y.o. female.  HPI: Asked to consult on this patient with a newly diagnosed, large pelvic mass and an acute bleeding episode.  She presented to the ED yesterday with LLQ abdominal pain.  Onset of the pain was 2 months ago.  The pain has been progressive in intensity.  She has had dyspepsia, bloating and increasing abdominal girth.  She reports minimal changes in her bowel/bladder habits.  She has a h/o ulcerative colitis but this has been quiescent recently.  She denies any weight loss.  There is no significant family h/o malignancies. She has had a screening colonoscopy.  A Pap smear 1 yr ago was negative.   A CT scan in the ED yesterday revealed a 19 cm abdominal/pelvic mass with ascites.  A CA-125 was 132 yesterday; a CEA was in range.  Earlier today she became hypotensive.  A hemoglobin had downward trended to 6.9.  A CT angio of the abdomen/pelvis showed a focal area of rupture with extravasation of blood/hemoperitoneum from the mass; felt to be a venous bleed.  There was also a suggestion of an intratumoral bleed.    Past Medical History:  Diagnosis Date  . Anemia   . Anxiety   . Chest pain   . Palpitations   . Proctitis   . Ulcerative colitis     Past Surgical History:  Procedure Laterality Date  . CESAREAN SECTION    . LUMBAR DISC SURGERY     Neck  . TUBAL LIGATION      Family History  Problem Relation Age of Onset  . Diabetes Mother   . Colon cancer Paternal Grandfather 58  . Colon cancer Paternal Uncle 48  . Heart Problems Sister        bicuspid aortic valve    Social History:  reports that she has never smoked. She has never used smokeless tobacco. She reports that she does not drink alcohol or use drugs.  Allergies:  Allergies  Allergen Reactions  . Codeine     REACTION: vomiting    Medications: I have reviewed the patient's current  medications.  Results for orders placed or performed during the hospital encounter of 05/12/18 (from the past 48 hour(s))  Lipase, blood     Status: None   Collection Time: 05/12/18  5:21 AM  Result Value Ref Range   Lipase 39 11 - 51 U/L    Comment: Performed at [facility], [address], [place], [place] [address]  Comprehensive metabolic panel     Status: Abnormal   Collection Time: 05/12/18  5:21 AM  Result Value Ref Range   Sodium 140 135 - 145 mmol/L   Potassium 3.9 3.5 - 5.1 mmol/L   Chloride 104 98 - 111 mmol/L   CO2 24 22 - 32 mmol/L   Glucose, Bld 148 (H) 70 - 99 mg/dL   BUN 12 6 - 20 mg/dL   Creatinine, Ser 0.73 0.44 - 1.00 mg/dL   Calcium 9.3 8.9 - 10.3 mg/dL   Total Protein 6.3 (L) 6.5 - 8.1 g/dL   Albumin 3.8 3.5 - 5.0 g/dL   AST 43 (H) 15 - 41 U/L   ALT 17 0 - 44 U/L   Alkaline Phosphatase 41 38 - 126 U/L   Total Bilirubin 0.7 0.3 - 1.2 mg/dL   GFR calc non Af Amer >60 >60 mL/min   GFR calc Af Amer >  60 >60 mL/min    Comment: (NOTE) The eGFR has been calculated using the CKD EPI equation. This calculation has not been validated in all clinical situations. eGFR's persistently <60 mL/min signify possible Chronic Kidney Disease.    Anion gap 12 5 - 15    Comment: Performed at [facility] [address], [place] [address]  CBC     Status: None   Collection Time: 05/12/18  5:21 AM  Result Value Ref Range   WBC 5.9 4.0 - 10.5 K/uL   RBC 4.26 3.87 - 5.11 MIL/uL   Hemoglobin 13.8 12.0 - 15.0 g/dL   HCT 41.0 36.0 - 46.0 %   MCV 96.2 78.0 - 100.0 fL   MCH 32.4 26.0 - 34.0 pg   MCHC 33.7 30.0 - 36.0 g/dL   RDW 12.7 11.5 - 15.5 %   Platelets 221 150 - 400 K/uL    Comment: Performed at [facility], [place] [address], [place], [place] [address]  CA 125     Status: Abnormal   Collection Time: 05/12/18  8:25 AM  Result Value Ref Range   Cancer Antigen (CA) 125 132.0 (H) 0.0 - 38.1 U/mL    Comment: (NOTE) [employer]  Electrochemiluminescence Immunoassay (ECLIA) Values obtained with different assay methods or kits cannot be used interchangeably.  Results cannot be interpreted as absolute evidence of the presence or absence of malignant disease. Performed At: [facility] [place], [place] [id number] Rush Farmer MD AG:[id number]   CEA     Status: None   Collection Time: 05/12/18  8:25 AM  Result Value Ref Range   CEA 2.0 0.0 - 4.7 ng/mL    Comment: (NOTE)                             Nonsmokers          <3.9                             Smokers             <5.6 [employer] Electrochemiluminescence Immunoassay (ECLIA) Values obtained with different assay methods or kits cannot be used interchangeably.  Results cannot be interpreted as absolute evidence of the presence or absence of malignant disease. Performed At: [facility] [place], [place] [id number] Rush Farmer MD [id number]   Glucose, capillary     Status: Abnormal   Collection Time: 05/13/18  5:22 AM  Result Value Ref Range   Glucose-Capillary 165 (H) 70 - 99 mg/dL  CBC with Differential/Platelet     Status: Abnormal   Collection Time: 05/13/18  5:44 AM  Result Value Ref Range   WBC 15.4 (H) 4.0 - 10.5 K/uL   RBC 2.14 (L) 3.87 - 5.11 MIL/uL   Hemoglobin 6.9 (LL) 12.0 - 15.0 g/dL    Comment: REPEATED TO VERIFY CRITICAL RESULT CALLED TO, READ BACK BY AND VERIFIED WITHJarold Motto RN [phone] 05/13/18 HMILES    HCT 21.3 (L) 36.0 - 46.0 %   MCV 99.5 78.0 - 100.0 fL   MCH 32.2 26.0 - 34.0 pg   MCHC 32.4 30.0 - 36.0 g/dL   RDW 12.9 11.5 - 15.5 %   Platelets 197 150 - 400 K/uL   Neutrophils Relative % 80 %   Neutro Abs 12.4 (H) 1.7 - 7.7 K/uL   Lymphocytes  Relative 13 %   Lymphs Abs 2.0 0.7 - 4.0 K/uL   Monocytes Relative 6 %   Monocytes Absolute 0.9 0.1 - 1.0 K/uL   Eosinophils Relative 0 %   Eosinophils Absolute 0.0 0.0 - 0.7 K/uL   Basophils Relative 0 %   Basophils  Absolute 0.0 0.0 - 0.1 K/uL   Immature Granulocytes 1 %   Abs Immature Granulocytes 0.1 0.0 - 0.1 K/uL    Comment: Performed at [facility], [place] [address], [place], [place] [address]  Comprehensive metabolic panel     Status: Abnormal   Collection Time: 05/13/18  5:44 AM  Result Value Ref Range   Sodium 132 (L) 135 - 145 mmol/L    Comment: DELTA CHECK NOTED   Potassium 4.5 3.5 - 5.1 mmol/L   Chloride 99 98 - 111 mmol/L   CO2 22 22 - 32 mmol/L   Glucose, Bld 189 (H) 70 - 99 mg/dL   BUN 20 6 - 20 mg/dL   Creatinine, Ser 1.75 (H) 0.44 - 1.00 mg/dL    Comment: DELTA CHECK NOTED   Calcium 8.3 (L) 8.9 - 10.3 mg/dL   Total Protein 4.9 (L) 6.5 - 8.1 g/dL   Albumin 3.2 (L) 3.5 - 5.0 g/dL   AST 43 (H) 15 - 41 U/L   ALT 14 0 - 44 U/L   Alkaline Phosphatase 29 (L) 38 - 126 U/L   Total Bilirubin 0.5 0.3 - 1.2 mg/dL   GFR calc non Af Amer 31 (L) >60 mL/min   GFR calc Af Amer 36 (L) >60 mL/min    Comment: (NOTE) The eGFR has been calculated using the CKD EPI equation. This calculation has not been validated in all clinical situations. eGFR's persistently <60 mL/min signify possible Chronic Kidney Disease.    Anion gap 11 5 - 15    Comment: Performed at [facility] [address] [place], [place] [address]  Type and screen [place]     Status: None (Preliminary result)   Collection Time: 05/13/18  6:59 AM  Result Value Ref Range   ABO/RH(D) PENDING    Antibody Screen PENDING    Sample Expiration 05/16/2018    Unit Number [id number]    Blood Component Type RED CELLS,LR    Unit division 00    Status of Unit ISSUED    Unit tag comment VERBAL ORDERS PER DR TIM OBYD    Transfusion Status OK TO TRANSFUSE    Crossmatch Result PENDING    Unit Number [id number]    Blood Component Type RED CELLS,LR    Unit division 00    Status of Unit ISSUED    Unit tag comment VERBAL ORDERS PER DR TIM OBYD    Transfusion Status      OK TO TRANSFUSE Performed at  [facility], [place] [address], [place], [place] [address]    Crossmatch Result PENDING   MRSA PCR Screening     Status: None   Collection Time: 05/13/18  8:29 AM  Result Value Ref Range   MRSA by PCR NEGATIVE NEGATIVE    Comment:        The GeneXpert MRSA Assay (FDA approved for NASAL specimens only), is one component of a comprehensive MRSA colonization surveillance program. It is not intended to diagnose MRSA infection nor to guide or monitor treatment for MRSA infections. Performed at [facility], [place] [address] [place], [place] [address]  Ct Abdomen Pelvis W Contrast  Result Date: 05/12/2018 CLINICAL DATA:  Left lower quadrant abdominal pain beginning a few hours ago. EXAM: CT ABDOMEN AND PELVIS WITH CONTRAST TECHNIQUE: Multidetector CT imaging of the abdomen and pelvis was performed using the standard protocol following bolus administration of intravenous contrast. CONTRAST:  176m OMNIPAQUE IOHEXOL 300 MG/ML  SOLN COMPARISON:  None. FINDINGS: Lower chest: Normal Hepatobiliary: Benign 9 mm cyst at the dome of the liver. No other liver parenchymal finding. No calcified gallstones. Pancreas: Normal Spleen: Normal Adrenals/Urinary Tract: Adrenal glands are normal. Right kidney is normal. Left kidney is normal except for a 2.6 cm cyst. The bladder is intrinsically normal. Stomach/Bowel: No primary bowel finding. Vascular/Lymphatic: Normal Reproductive: Complex pelvic mass measuring 19 x 12 x 13 cm likely of ovarian origin. Ascites, likely malignant. I do not see any obvious distant nodular components. The uterus itself is normal. Other: No free air. 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 No evidence of bowel pathology or other organ pathology. Electronically Signed   By: MNelson ChimesM.D.   On: 05/12/2018 07:48   Dg Chest Port 1 View  Result Date:  05/13/2018 CLINICAL DATA:  Central line placement EXAM: PORTABLE CHEST 1 VIEW COMPARISON:  Chest x-ray dated 04/19/2016. FINDINGS: Study is hypoinspiratory. Given the low lung volumes, lungs appear clear. Heart size appears stable. RIGHT subclavian central line appears adequately positioned with tip overlying the RIGHT atrium. No pneumothorax seen. IMPRESSION: 1. RIGHT subclavian central line adequately position with tip overlying the RIGHT atrium. 2. No pneumothorax. 3. Low lung volumes. Electronically Signed   By: SFranki CabotM.D.   On: 05/13/2018 09:15   Ct Angio Abd/pel W/ And/or W/o  Result Date: 05/13/2018 CLINICAL DATA:  57year old female with acute onset anemia, increasing abdominal pain and distension and a recently diagnosed massive suspected ovarian neoplasm. Evaluate for evidence of active bleeding. EXAM: CT ANGIOGRAPHY ABDOMEN AND PELVIS WITH CONTRAST AND WITHOUT CONTRAST TECHNIQUE: Multidetector CT imaging of the abdomen and pelvis was performed using the standard protocol during bolus administration of intravenous contrast. Multiplanar reconstructed images and MIPs were obtained and reviewed to evaluate the vascular anatomy. CONTRAST:  879mISOVUE-370 IOPAMIDOL (ISOVUE-370) INJECTION 76% COMPARISON:  CT scan of the abdomen and pelvis with intravenous contrast 05/12/2018 FINDINGS: VASCULAR Aorta: Normal caliber aorta without aneurysm, dissection, vasculitis or significant stenosis. Celiac: Patent without evidence of aneurysm, dissection, vasculitis or significant stenosis. SMA: Patent without evidence of aneurysm, dissection, vasculitis or significant stenosis. Renals: Both renal arteries are patent without evidence of aneurysm, dissection, vasculitis, fibromuscular dysplasia or significant stenosis. IMA: Patent without evidence of aneurysm, dissection, vasculitis or significant stenosis. Inflow: Patent without evidence of aneurysm, dissection, vasculitis or significant stenosis. Proximal Outflow:  Bilateral common femoral and visualized portions of the superficial and profunda femoral arteries are patent without evidence of aneurysm, dissection, vasculitis or significant stenosis. Veins: No evidence of venous thrombosis or occlusion on the delayed phase images. Widely patent hepatic, portal, visceral and pelvic veins. The IVC is patent. Review of the MIP images confirms the above findings. NON-VASCULAR Lower chest: Small right pleural effusion with associated right lower lobe atelectasis. The visualized cardiac structures are within normal limits for size. Trace pericardial effusion. Moderate hiatal hernia. Hepatobiliary: Normal hepatic contour and morphology. No discrete hepatic lesions. High attenuation material within the gallbladder lumen consistent with vicarious excretion of intravenous contrast from yesterday's CT scan. No intra or extrahepatic  biliary ductal dilatation. Pancreas: Unremarkable. No pancreatic ductal dilatation or surrounding inflammatory changes. Spleen: Normal in size without focal abnormality. Adrenals/Urinary Tract: Adrenal glands are unremarkable. Kidneys are normal, without renal calculi, focal solid lesion, or hydronephrosis. 2.6 cm simple cyst in the interpolar left kidney again noted. Bladder is unremarkable. Stomach/Bowel: Stomach is within normal limits. Appendix appears normal. No evidence of bowel wall thickening, distention, or inflammatory changes. Lymphatic: No suspicious lymphadenopathy. Reproductive: Large heterogeneous mass in the low anterior abdomen presumably related to the right adnexa measures slightly larger at 21.3 by 10.9 compared to 19 x 8.9 cm. There is heterogeneous high attenuation and low attenuation in the center of the mass. The majority of the mass is relatively well and capsulated save for the right inferior margin which is irregular and appears to communicate with an adjacent pocket of intermediate attenuation ascites. Overall, findings are highly  concerning for a hemorrhagic and rupturing ovarian carcinoma. Other: Intermediate attenuation ascites has increased since the recent prior examination concerning for intraperitoneal hemorrhage. No evidence of active extravasation. The arterial structures are diffusely low in caliber consistent with shock/pressor usage. Musculoskeletal: No acute fracture or aggressive appearing lytic or blastic osseous lesion. IMPRESSION: VASCULAR 1. Diffuse arterial spasm consistent with the clinical history of shock and intravenous pressor usage. 2. No evidence of active arterial bleeding or other acute vascular abnormality. NON-VASCULAR 1. CT findings are concerning for a hemorrhaging/rupturing ovarian carcinoma affiliated with the right adnexum. The mass appears to have enlarged in the short interval since yesterday's examination suggesting internal hemorrhage. Additionally, the right and right inferolateral margins of the mass are diffusely irregular concerning with an area of rupture. Finally, there has been a significant interval increase in the volume of intermediate attenuation ascites likely reflecting dilute hemoperitoneum. 2. Developing small right pleural effusion, likely reactive. 3. Vicarious excretion of contrast material into the gallbladder lumen. Critical Value/emergent results were called by telephone at the time of interpretation on 05/13/2018 at 12:35 pm to Dr. Lahoma Crocker, who verbally acknowledged these results. Signed, Criselda Peaches, MD [facility] Radiology Electronically Signed   By: Jacqulynn Cadet M.D.   On: 05/13/2018 12:43    Review of Systems  Constitutional: Negative for chills and fever.  Cardiovascular: Negative for chest pain.  Gastrointestinal: Positive for abdominal pain.       LLQ   Blood pressure 121/65, pulse 91, temperature 98.6 F (37 C), temperature source Oral, resp. rate (!) 26, height 5' 4"  (1.626 m), weight 139 lb  11.2 oz (63.4 kg), last menstrual period 08/29/2011, SpO2 100 %. Physical Exam  Constitutional: She is oriented to person, place, and time. She appears well-developed and well-nourished. She appears distressed.  Mild distress  HENT:  Head: Normocephalic and atraumatic.  Neck: Neck supple.  Cardiovascular: Regular rhythm.  GI: She exhibits distension.  Neurological: She is alert and oriented to person, place, and time.    Assessment/Plan: Large, abdominopelvic mass likely ovarian in origin.  Radiologic findings worrisome for a malignancy. Now in hypovolemic shock 2/2 possible intratumor bleed and/or rupture  >serial labs >to OR when hgb in the 10 range and no obvious coagulopathy.  A limited procedure is planned primarily to establish hemostasis. >informed consent was obtained for an exploratory laparotomy, possible salpingo-oophorectomy, possible hysterectomy.  The risks were reviewed including but not limited to VTE, hemorrhage, infection and injury to abdominal/pelvic viscera.  Questions were answered to the patient's stated satisfaction. Lahoma Crocker 05/13/2018, 12:51 PM

## 2018-05-13 NOTE — Progress Notes (Signed)
RT NOTE:  Transported patient to CT on CPAP @ 5 100% without incident.

## 2018-05-13 NOTE — Progress Notes (Signed)
0500- Blood pressure taken manually.  Blood pressure 80/60.

## 2018-05-13 NOTE — Significant Event (Signed)
Jacqueline Castro is a 71 yof with hx of ulcerative colitis, presenting the am of 8/2 with severe abdominal pain and found to have large adnexal mass. She was hemodynamically stable and admitted for pain-control.   She developed hypotension this am, was given a 1 liter bolus with only slight improvement. A second liter was started.   Hgb is 6.9 this am, down from 13.8 yesterday. There is no obvious bleeding.   There was difficulty obtaining IV access yesterday and she has 1 PIV.   She appears pale, mentating appropriately, denies chest pain or headache.   Plan for 2 units RBC ASAP, additional IVF, phenylephrine if BP fails to improve quickly. Discussed Dr. Rennis Harding of PCCM, his care much appreciated.

## 2018-05-13 NOTE — Interval H&P Note (Signed)
History and Physical Interval Note:  05/13/2018 6:10 PM  Jacqueline Castro  has presented today for surgery, with the diagnosis of abdominal mass  The various methods of treatment have been discussed with the patient and family. After consideration of risks, benefits and other options for treatment, the patient has consented to  Procedure(s): EXPLORATORY LAPAROTOMY POSSIBLE SALPINGO-OOPHORECTOMY (N/A) as a surgical intervention .  We will possibly perform hysterectomy as clinically indicated. I discussed this with the patient. The patient's history has been reviewed, patient examined, no change in status, stable for surgery.  I have reviewed the patient's chart and labs. Questions were answered to the patient's satisfaction.   Thereasa Solo

## 2018-05-13 NOTE — Progress Notes (Signed)
51- Dr. Olevia Bowens at bedside seeing patient.  Critical lab report called to me.  Lab reports that patient has hgb of 6.9.  I have informed Dr. Olevia Bowens about patient's condition and blood pressure not coming up. I have also made Dr. Olevia Bowens aware at the bedside that hgb was 6.9.    Dr. Olevia Bowens has requested ICU bed.  Rapid response staff call to assist with patient.  2nd liter of NS continues to infuse.  02 at 2L per [place] applied.  Nurse at bedside.

## 2018-05-13 NOTE — Consult Note (Addendum)
PULMONARY / CRITICAL CARE MEDICINE   Name: Jacqueline Castro MRN: [id number] DOB: 17-May-1961    ADMISSION DATE:  05/12/2018 CONSULTATION DATE:  05/13/2018  REFERRING MD:  Isidore Moos  CHIEF COMPLAINT:  Hypotension with acute anemia  HISTORY OF PRESENT ILLNESS:   Jacqueline Castro is a 57 y.o. female with medical history significant of ulcerative colitis, anxiety, anemia who presented to th ED for LLQ abdominal pain.  She notes that the pain has been present off and on for about 2 months, but on the AM of admission it started and was much more severe and constant.  Pain is 10/10 causing her to feel she has to move constantly, sharp and throbbing.  She has had morphine, dilaudid, toradol and fentanyl in the ED without relief.  Rest does not make it better.  She has had bloating as well that she only noticed the first time this morning.  She has had normal BM and no constipation or blood loss of which she is aware.   ED Course: In the ED, she was noted to have severe pain, elevated BP at times, relatively normal blood work, but a CT abdomen pelvis showed a large pelvic mass and ascites, likely malignant.  The ED discussed with gynecology/oncology and she was given an appointment for Monday.  She had persistent pain despite treatment and was admitted for pain control.   This AM the patient became hypotensive to 65/52. She received 1L NS with minimal improvement. A repeat CBC showed that her Hb had fallen from 13.8 on admission to 6.9. She reports no recent hx of melena, hematechezia or hematemesis. Other than UC there is no hx PUD. Upon my arrival to the bedside the patient was receiving a 2nd liter of NS + neosynephrine, yet still with a BP of 78/44 (MAP 55). The patient is being accepted in transfer to the ICU for management of hypotension.  PAST MEDICAL HISTORY :  She  has a past medical history of Anemia, Anxiety, Chest pain, Palpitations, Proctitis, and Ulcerative colitis.  PAST SURGICAL HISTORY: She  has a  past surgical history that includes Tubal ligation; Lumbar disc surgery; and Cesarean section.  Allergies  Allergen Reactions  . Codeine     REACTION: vomiting    No current facility-administered medications on file prior to encounter.    Current Outpatient Medications on File Prior to Encounter  Medication Sig  . ALPRAZolam (XANAX) 0.5 MG tablet Take 0.5 mg by mouth 3 (three) times daily as needed for anxiety.   . DULoxetine (CYMBALTA) 30 MG capsule Take 120 mg by mouth daily.   . traZODone (DESYREL) 50 MG tablet Take 100 mg by mouth at bedtime.  . pantoprazole (PROTONIX) 20 MG tablet Take 1 tablet (20 mg total) by mouth daily. (Patient not taking: Reported on 05/12/2018)    FAMILY HISTORY:  Her family history includes Colon cancer (age of onset: 34) in her paternal grandfather and paternal uncle; Diabetes in her mother; Heart Problems in her sister.  SOCIAL HISTORY: She  reports that she has never smoked. She has never used smokeless tobacco. She reports that she does not drink alcohol or use drugs.  REVIEW OF SYSTEMS:   As per HPI  SUBJECTIVE:  C/o abdominal pain. No SOB, chest pain  VITAL SIGNS: BP (!) 78/44 (BP Location: Right Arm)   Pulse (!) 102   Temp 98.6 F (37 C) (Oral)   Resp 14   Ht 5' 4"  (1.626 m)   Wt 63.4 kg (139  lb 11.2 oz)   LMP 08/29/2011   SpO2 100%   BMI 23.98 kg/m   HEMODYNAMICS:    VENTILATOR SETTINGS:    INTAKE / OUTPUT: No intake/output data recorded.  PHYSICAL EXAMINATION: General:  WD/WN WF mod distress. Pale membranes Neuro:  A& Ox3. No focal deficits HEENT:  [place]/AT PERRL. EOMI Cardiovascular:  Tachy with systolic murmur Lungs:  Clear  Abdomen:  Distended, +BS. Marked tenderness with minimal palpation Musculoskeletal:  No acute joints. 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  Sepsis Markers No results for input(s): LATICACIDVEN, PROCALCITON, O2SATVEN in the last 168 hours.  ABG No results for input(s): PHART, PCO2ART, PO2ART in the last 168 hours.  Liver Enzymes Recent Labs  Lab 05/12/18 0521 05/13/18 0544  AST 43* 43*  ALT 17 14  ALKPHOS 41 29*  BILITOT 0.7 0.5  ALBUMIN 3.8 3.2*    Cardiac Enzymes No results for input(s): TROPONINI, PROBNP in the last 168 hours.  Glucose Recent Labs  Lab 05/13/18 0522  GLUCAP 165*    Imaging No results found.   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 R/o GIB  ASSESSMENT / PLAN:  PULMONARY A: Patient became more hypoxemic after ICU arrival not corrected with NRM - placed on BiPAP 5/5 P:   Will repeat CXR  CARDIOVASCULAR A:  Hypovolemic shock P:  Fluid resuscitation. Transfuse  RENAL A:   AKI likely secondary to hypotension P:   Fluids  GASTROINTESTINAL A:   Possible UGIB P:   Will hemoccult stools  HEMATOLOGIC A:   Acute blood loss anemia P:  Tx; Check coags  INFECTIOUS A:   No obvious infection P:   Follow  ENDOCRINE A:   No prior endocrine disorders   P:   Status     FAMILY  - Updates: Spoke with daughter as to plans  Critical care time: 60 min  Pulmonary and [place] Pager: [phone]  05/13/2018, 7:58 AM  Addendum:  Have consulted GI and [facility]. Will also hemoccult stools.  Addendum (10:18) No stool in vault per GI. Will arrange CT angio  abd/pelvis with reduced contrast and delayed venous images to r/o bleeding related to pelvic mass.

## 2018-05-14 DIAGNOSIS — R578 Other shock: Secondary | ICD-10-CM

## 2018-05-14 DIAGNOSIS — J9600 Acute respiratory failure, unspecified whether with hypoxia or hypercapnia: Secondary | ICD-10-CM

## 2018-05-14 LAB — BPAM PLATELET PHERESIS
BLOOD PRODUCT EXPIRATION DATE: 201908042359
Blood Product Expiration Date: 201908042359
ISSUE DATE / TIME: 201908032016
ISSUE DATE / TIME: 201908032011
UNIT TYPE AND RH: 6200
Unit Type and Rh: 6200

## 2018-05-14 LAB — CBC
HEMATOCRIT: 33.8 % — AB (ref 36.0–46.0)
Hemoglobin: 11.8 g/dL — ABNORMAL LOW (ref 12.0–15.0)
MCH: 30.9 pg (ref 26.0–34.0)
MCHC: 34.9 g/dL (ref 30.0–36.0)
MCV: 88.5 fL (ref 78.0–100.0)
PLATELETS: 140 10*3/uL — AB (ref 150–400)
RBC: 3.82 MIL/uL — ABNORMAL LOW (ref 3.87–5.11)
RDW: 15.2 % (ref 11.5–15.5)
WBC: 11.7 10*3/uL — AB (ref 4.0–10.5)

## 2018-05-14 LAB — PREPARE PLATELET PHERESIS
Unit division: 0
Unit division: 0

## 2018-05-14 LAB — POCT I-STAT 3, ART BLOOD GAS (G3+)
Acid-Base Excess: 1 mmol/L (ref 0.0–2.0)
BICARBONATE: 26.8 mmol/L (ref 20.0–28.0)
O2 Saturation: 99 %
PH ART: 7.362 (ref 7.350–7.450)
TCO2: 28 mmol/L (ref 22–32)
pCO2 arterial: 47.2 mmHg (ref 32.0–48.0)
pO2, Arterial: 125 mmHg — ABNORMAL HIGH (ref 83.0–108.0)

## 2018-05-14 LAB — BASIC METABOLIC PANEL
ANION GAP: 5 (ref 5–15)
BUN: 17 mg/dL (ref 6–20)
CO2: 26 mmol/L (ref 22–32)
Calcium: 7.8 mg/dL — ABNORMAL LOW (ref 8.9–10.3)
Chloride: 105 mmol/L (ref 98–111)
Creatinine, Ser: 0.97 mg/dL (ref 0.44–1.00)
Glucose, Bld: 128 mg/dL — ABNORMAL HIGH (ref 70–99)
Potassium: 4.2 mmol/L (ref 3.5–5.1)
Sodium: 136 mmol/L (ref 135–145)

## 2018-05-14 LAB — PHOSPHORUS: Phosphorus: 2.7 mg/dL (ref 2.5–4.6)

## 2018-05-14 LAB — TRIGLYCERIDES: TRIGLYCERIDES: 122 mg/dL (ref <150)

## 2018-05-14 LAB — MAGNESIUM: Magnesium: 1.6 mg/dL — ABNORMAL LOW (ref 1.7–2.4)

## 2018-05-14 MED ORDER — DEXMEDETOMIDINE HCL IN NACL 400 MCG/100ML IV SOLN
0.1000 ug/kg/h | INTRAVENOUS | Status: DC
  Administered 2018-05-14: 0.4 ug/kg/h via INTRAVENOUS
  Filled 2018-05-14: qty 100

## 2018-05-14 MED ORDER — DEXTROSE-NACL 5-0.45 % IV SOLN: INTRAVENOUS | Status: DC

## 2018-05-14 MED ORDER — MAGNESIUM SULFATE 4 GM/100ML IV SOLN
4.0000 g | Freq: Once | INTRAVENOUS | Status: AC
  Administered 2018-05-14: 4 g via INTRAVENOUS
  Filled 2018-05-14: qty 100

## 2018-05-14 MED ORDER — FENTANYL CITRATE (PF) 100 MCG/2ML IJ SOLN
100.0000 ug | INTRAMUSCULAR | Status: DC | PRN
  Administered 2018-05-14 – 2018-05-15 (×10): 100 ug via INTRAVENOUS
  Filled 2018-05-14 (×10): qty 2

## 2018-05-14 NOTE — Progress Notes (Signed)
1 Day Post-Op Procedure(s) (LRB): EXPLORATORY LAPAROTOMY, TOTAL ABDOMINAL HYSTERECTOMY, BILATERAL SALPINGO-OOPHORECTOMY, OMENTECTOMY (N/A)  Subjective: Patient reports is intubated   Objective: Vital signs in last 24 hours: Temp:  [98.1 F (36.7 C)-101.6 F (38.7 C)] 99.6 F (37.6 C) (08/04 0819) Pulse Rate:  [87-122] 87 (08/04 0837) Resp:  [12-26] 20 (08/04 0837) BP: (94-183)/(49-98) 119/67 (08/04 0837) SpO2:  [93 %-100 %] 98 % (08/04 0837) Arterial Line BP: (103-179)/(61-96) 143/78 (08/04 0800) FiO2 (%):  [60 %-100 %] 60 % (08/04 0837) Weight:  [139 lb 12.4 oz (63.4 kg)] 139 lb 12.4 oz (63.4 kg) (08/03 1645) Last BM Date: 05/11/18  Intake/Output from previous day: 08/03 0701 - 08/04 0700 In: 8885.9 [I.V.:2876.7; CHEKB:5248; IV Piggyback:186.3] Out: 5730 [Urine:2080; Blood:3650]  Physical Examination: General: Resting comfortably, intubated GI: softly distended, dressing C/D Extremities: extremities normal, atraumatic, no cyanosis or edema  Labs: WBC/Hgb/Hct/Plts:  11.7/11.8/33.8/140 (08/04 0422) BUN/Cr/glu/ALT/AST/amyl/lip:  17/0.97/--/--/--/--/-- (08/04 0422)   Dg Chest Port 1 View  Result Date: 05/13/2018 CLINICAL DATA:  Endotracheally intubated. EXAM: PORTABLE CHEST 1 VIEW COMPARISON:  Earlier this day. FINDINGS: Endotracheal tube tip 1.7 cm from the carina. Right subclavian central venous catheter tip at the atrial caval junction. No pneumothorax. Developing hazy opacity at the lung bases, right greater than left, likely atelectasis and pleural effusions. No pulmonary edema. Unchanged heart size and mediastinal contours allowing for differences in technique. IMPRESSION: 1. Endotracheal tube tip 1.7 cm from the carina. Right subclavian central line remains in place. 2. Developing hazy opacity at the lung bases, right greater than left, likely atelectasis and pleural effusions. Electronically Signed   By: Jeb Levering M.D.   On: 05/13/2018 21:50     Assessment:  57  y.o. s/p Procedure(s): EXPLORATORY LAPAROTOMY, TOTAL ABDOMINAL HYSTERECTOMY, BILATERAL SALPINGO-OOPHORECTOMY, OMENTECTOMY: stable Pain:  Pain is well-controlled on IV medications.  Heme: Hemoglobin in range s/p transfusion(s)  ID: Fever several hours ago; temperature cure downward trending.  A CXR yesterday showed atelectasis.  Mild leukocytosis.  Likely 2/2 inflammation.  Consider response to medications, transfusions.  CV: B/Ps labile.  ?hypotension 2/2 medications I.e., propofol  GI:  NPO  FEN: Electrolytes in range.  Adequate u.o.   Prophylaxis: intermittent pneumatic compression boots.  Plan: >monitor temperature curve; tylenol prn >extubation plans per Critical Care team >encourage IS s/p extubation >OK to start a soft diet after extubation   LOS: 1 day    Lahoma Crocker 05/14/2018, 11:06 AM

## 2018-05-14 NOTE — Progress Notes (Signed)
  Started sbt off diprivan Oriented per daughter but restless and anxious - which is her baseline  Plan Dc diprivan from [facility] SBT to continue Start precedex and will aim to extubate on percedex if does well with SBT  Dr. Brand Males, M.D., [facility].C.P Pulmonary and Critical Care Medicine Staff Physician, [place] Director - Interstitial Lung Disease  Program  Pulmonary [place] at [place], [place], [address]  Pager: [phone], If no answer or between  15:00h - 7:00h: call [phone] Telephone: [phone]

## 2018-05-14 NOTE — Procedures (Signed)
Extubation Procedure Note  Patient Details:   Name: Jacqueline Castro DOB: 1961/04/06 MRN: [id number]   Airway Documentation:   Patient extubated per order and placed on 10 LPM HFNC. Patient had positive cuff leak prior to extubation. Patient has strong cough and able to voice. Vent end date: (not recorded) Vent end time: (not recorded)   Evaluation  O2 sats: stable throughout Complications: No apparent complications Patient did tolerate procedure well. Bilateral Breath Sounds: Clear, Diminished   Yes  Ander Purpura 05/14/2018, 3:10 PM

## 2018-05-14 NOTE — Consult Note (Signed)
PULMONARY / CRITICAL CARE MEDICINE   Name: Jacqueline Castro MRN: [id number] DOB: 10/14/1960    ADMISSION DATE:  05/12/2018 CONSULTATION DATE:  05/13/2018  REFERRING MD:  Opyd, T.  CHIEF COMPLAINT:  Hypotension with acute anemia  brief Jacqueline Castro is a 57 y.o. female with medical history significant of ulcerative colitis, anxiety, anemia who presented to th ED 05/12/2018 for LLQ abdominal pain.  She notes that the pain has been present off and on for about 2 months, but on the AM of admission it started and was much more severe and constant.  Pain is 10/10 causing her to feel she has to move constantly, sharp and throbbing.  She has had morphine, dilaudid, toradol and fentanyl in the ED without relief.  Rest does not make it better.  She has had bloating as well that she only noticed the first time this morning.  She has had normal BM and no constipation or blood loss of which she is aware.    ED Course: In the ED, she was noted to have severe pain, elevated BP at times, relatively normal blood work, but a CT abdomen pelvis showed a large pelvic mass and ascites, likely malignant.  The ED discussed with gynecology/oncology and she was given an appointment for Monday.  She had persistent pain despite treatment and was admitted for pain control.   This AM 05/13/18 - the patient became hypotensive to 65/52. She received 1L NS with minimal improvement. A repeat CBC showed that her Hb had fallen from 13.8 on admission to 6.9. She reports no recent hx of melena, hematechezia or hematemesis. Other than UC there is no hx PUD. Upon CCM  arrival to the bedside the patient was receiving a 2nd liter of NS + neosynephrine, yet still with a BP of 78/44 (MAP 55). The patient is being accepted in transfer to the ICU for management of hypotension.  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 R/o GIB  EVENTS 05/13/18 -  Rx yesterday for hemorrhagic shock. Rt scv line placed. 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 Per RN _ wua off diprivan - did well but for mild agitation. Feels patient can be extubated easil     VITAL SIGNS: BP (!) 105/59   Pulse 79   Temp 98 F (36.7 C) (Oral)   Resp 20   Ht 5' 4"  (1.626 m)   Wt 63.4 kg (139 lb 12.4 oz)   LMP 08/29/2011   SpO2 98%   BMI 23.99 kg/m   HEMODYNAMICS: CVP:  [3 mmHg-10 mmHg] 7 mmHg  VENTILATOR SETTINGS: Vent Mode: PRVC FiO2 (%):  [50 %-100 %] 50 % Set Rate:  [15 bmp-20 bmp] 20 bmp Vt Set:  [500 mL] 500 mL PEEP:  [5 cmH20] 5 cmH20 Plateau Pressure:  [13 cmH20-20 cmH20] 16 cmH20  INTAKE / OUTPUT: I/O last 3 completed shifts: In: 8885.9 [I.V.:2876.7; Blood:5823; IV Piggyback:186.3] Out: 5730 [Urine:2080; Blood:3650]  PHYSICAL EXAMINATION:   General Appearance:    Looks critically ill on vent.   Head:    Normocephalic, without obvious abnormality, atraumatic  Eyes:    PERRL - yes, conjunctiva/corneas - clear      Ears:  Normal external ear canals, both ears  Nose:   NG tube - no  Throat:  ETT TUBE - yes , OG tube - yes  Neck:   Supple,  No enlargement/tenderness/nodules     Lungs:     Clear to auscultation bilaterally, Ventilator   Synchrony - yes  Chest wall:    No deformity  Heart:    S1 and S2 normal, no murmur, CVP - no.  Pressors - no, she is off  Abdomen:     Soft, no masses, no organomegaly. Has dressing. With diprivan on - plapation did not result in tenderness  Genitalia:    Not done  Rectal:   not done  Extremities:   Extremities- intact     Skin:   Intact in exposed areas .      Neurologic:   Sedation - diprivan gtt -> RASS - -3     PULMONARY Recent Labs  Lab 05/13/18 1907 05/13/18 1933 05/13/18 2039  05/13/18 2152 05/13/18 2342  PHART 7.367 7.376 7.408 7.247* 7.352  PCO2ART 39.5 36.5 32.1 57.4* 43.3  PO2ART 196.0* 367.0* 110.0* 213.0* 300.0*  HCO3 22.6 21.6 20.2 25.0 24.0  TCO2 24 23 21* 27 25  O2SAT 100.0 100.0 98.0 100.0 100.0    CBC Recent Labs  Lab 05/12/18 0521 05/13/18 0544  05/13/18 1914 05/13/18 1933 05/13/18 2039 05/14/18 0422  HGB 13.8 6.9*   < >  --  10.9* 11.2* 11.8*  HCT 41.0 21.3*   < >  --  32.0* 33.0* 33.8*  WBC 5.9 15.4*  --   --   --   --  11.7*  PLT 221 197  --  68*  --   --  140*   < > = values in this interval not displayed.    COAGULATION Recent Labs  Lab 05/13/18 0821 05/13/18 1610 05/13/18 1914  INR 1.36 1.36 1.35    CARDIAC  No results for input(s): TROPONINI in the last 168 hours. No results for input(s): PROBNP in the last 168 hours.   CHEMISTRY Recent Labs  Lab 05/12/18 0521 05/13/18 0544 05/13/18 1840 05/13/18 1907 05/13/18 1933 05/13/18 2039 05/14/18 0422  NA 140 132* 130* 133* 132* 134* 136  K 3.9 4.5 5.7* 5.9* 5.9* 4.9 4.2  CL 104 99  --   --   --   --  105  CO2 24 22  --   --   --   --  26  GLUCOSE 148* 189*  --   --   --   --  128*  BUN 12 20  --   --   --   --  17  CREATININE 0.73 1.75*  --   --   --   --  0.97  CALCIUM 9.3 8.3*  --   --   --   --  7.8*   Estimated Creatinine Clearance: 53.9 mL/min (by C-G formula based on SCr of 0.97 mg/dL).   LIVER Recent Labs  Lab 05/12/18 0521 05/13/18 0544 05/13/18 0821 05/13/18 1610 05/13/18 1914  AST 43* 43*  --   --   --   ALT 17 14  --   --   --   ALKPHOS 41 29*  --   --   --   BILITOT 0.7 0.5  --   --   --   PROT 6.3* 4.9*  --   --   --   ALBUMIN 3.8 3.2*  --   --   --  INR  --   --  1.36 1.36 1.35     INFECTIOUS No results for input(s): LATICACIDVEN, PROCALCITON in the last 168 hours.   ENDOCRINE CBG (last 3)  Recent Labs    05/13/18 0522  GLUCAP 165*         IMAGING x48h  - image(s) personally visualized  -   highlighted in bold Dg  Chest Port 1 View  Result Date: 05/13/2018 CLINICAL DATA:  Endotracheally intubated. EXAM: PORTABLE CHEST 1 VIEW COMPARISON:  Earlier this day. FINDINGS: Endotracheal tube tip 1.7 cm from the carina. Right subclavian central venous catheter tip at the atrial caval junction. No pneumothorax. Developing hazy opacity at the lung bases, right greater than left, likely atelectasis and pleural effusions. No pulmonary edema. Unchanged heart size and mediastinal contours allowing for differences in technique. IMPRESSION: 1. Endotracheal tube tip 1.7 cm from the carina. Right subclavian central line remains in place. 2. Developing hazy opacity at the lung bases, right greater than left, likely atelectasis and pleural effusions. Electronically Signed   By: Jeb Levering M.D.   On: 05/13/2018 21:50   Dg Chest Port 1 View  Result Date: 05/13/2018 CLINICAL DATA:  Central line placement EXAM: PORTABLE CHEST 1 VIEW COMPARISON:  Chest x-ray dated 04/19/2016. FINDINGS: Study is hypoinspiratory. Given the low lung volumes, lungs appear clear. Heart size appears stable. RIGHT subclavian central line appears adequately positioned with tip overlying the RIGHT atrium. No pneumothorax seen. IMPRESSION: 1. RIGHT subclavian central line adequately position with tip overlying the RIGHT atrium. 2. No pneumothorax. 3. Low lung volumes. Electronically Signed   By: Franki Cabot M.D.   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 Evaluate for evidence of active bleeding. EXAM: CT ANGIOGRAPHY ABDOMEN AND PELVIS WITH CONTRAST AND WITHOUT CONTRAST TECHNIQUE: Multidetector CT imaging of the abdomen and pelvis was performed using the standard protocol during bolus administration of intravenous contrast. Multiplanar reconstructed images and MIPs  were obtained and reviewed to evaluate the vascular anatomy. CONTRAST:  9m ISOVUE-370 IOPAMIDOL (ISOVUE-370) INJECTION 76% COMPARISON:  CT scan of the abdomen and pelvis with intravenous contrast 05/12/2018 FINDINGS: VASCULAR Aorta: Normal caliber aorta without aneurysm, dissection, vasculitis or significant stenosis. Celiac: Patent without evidence of aneurysm, dissection, vasculitis or significant stenosis. SMA: Patent without evidence of aneurysm, dissection, vasculitis or significant stenosis. Renals: Both renal arteries are patent without evidence of aneurysm, dissection, vasculitis, fibromuscular dysplasia or significant stenosis. IMA: Patent without evidence of aneurysm, dissection, vasculitis or significant stenosis. Inflow: Patent without evidence of aneurysm, dissection, vasculitis or significant stenosis. Proximal Outflow: Bilateral common femoral and visualized portions of the superficial and profunda femoral arteries are patent without evidence of aneurysm, dissection, vasculitis or significant stenosis. Veins: No evidence of venous thrombosis or occlusion on the delayed phase images. Widely patent hepatic, portal, visceral and pelvic veins. The IVC is patent. Review of the MIP images confirms the above findings. NON-VASCULAR Lower chest: Small right pleural effusion with associated right lower lobe atelectasis. The visualized cardiac structures are within normal limits for size. Trace pericardial effusion. Moderate hiatal hernia. Hepatobiliary: Normal hepatic contour and morphology. No discrete hepatic lesions. High attenuation material within the gallbladder lumen consistent with vicarious excretion of intravenous contrast from yesterday's CT scan. No intra or extrahepatic biliary ductal dilatation. Pancreas: Unremarkable. No pancreatic ductal dilatation or surrounding inflammatory  changes. Spleen: Normal in size without focal abnormality. Adrenals/Urinary Tract: Adrenal glands are unremarkable.  Kidneys are normal, without renal calculi, focal solid lesion, or hydronephrosis. 2.6 cm simple cyst in the interpolar left kidney again noted. Bladder is unremarkable. Stomach/Bowel: Stomach is within normal limits. Appendix appears normal. No evidence of bowel wall thickening, distention, or inflammatory changes. Lymphatic: No suspicious lymphadenopathy. Reproductive: Large heterogeneous mass in the low anterior abdomen presumably related to the right adnexa measures slightly larger at 21.3 by 10.9 compared to 19 x 8.9 cm. There is heterogeneous high attenuation and low attenuation in the center of the mass. The majority of the mass is relatively well and capsulated save for the right inferior margin which is irregular and appears to communicate with an adjacent pocket of intermediate attenuation ascites. 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 Other: Intermediate attenuation ascites has increased since the recent prior examination concerning for intraperitoneal hemorrhage. No evidence of active extravasation. The arterial structures are diffusely low in caliber consistent with shock/pressor usage. Musculoskeletal: No acute fracture or aggressive appearing lytic or blastic osseous lesion. IMPRESSION: VASCULAR 1. Diffuse arterial spasm consistent with the clinical history of shock and intravenous pressor usage. 2. No evidence of active arterial bleeding or other acute vascular abnormality. NON-VASCULAR 1. CT findings are concerning for a hemorrhaging/rupturing ovarian carcinoma affiliated with the right adnexum. The mass appears to have enlarged in the short interval since yesterday's examination suggesting internal hemorrhage. Additionally, the right and right inferolateral margins of the mass are diffusely irregular concerning with an area of rupture. Finally, there has been a significant interval increase in the volume of intermediate attenuation ascites likely  reflecting dilute hemoperitoneum. 2. Developing small right pleural effusion, likely reactive. 3. Vicarious excretion of contrast material into the gallbladder lumen. Critical Value/emergent results were called by telephone at the time of interpretation on 05/13/2018 at 12:35 pm to Dr. Lahoma Crocker, who verbally acknowledged these results. Signed, Criselda Peaches, MD [facility] Radiology Electronically Signed   By: Jacqulynn Cadet M.D.   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 R/o GIB  ASSESSMENT / PLAN:  PULMONARY A: Post op acuet resp failure   - meet indication for SBT and possible extubation P:   SBT and plan to extuate  CARDIOVASCULAR A:  Hypovolemic shock  - resolved hemorrhagic shock after OR  P:  d5 half at kvp - PRBC for hgb </= 6.9gm%    - exceptions are   -  if ACS susepcted/confirmed then transfuse for hgb </= 8.0gm%,  or    -  active bleeding with hemodynamic instability, then transfuse regardless of hemoglobin value   At at all times try to transfuse 1 unit prbc as possible with exception of active hemorrhage   RENAL A:   AKI likely secondary to hypotension  - resolved AKI  P:   Maintain bp/jhr  GASTROINTESTINAL A:   No GI bleed P:   PPI  HEMATOLOGIC A:   Acute blood loss anemia  - source Ovarian rupture. Resolved post surgery  P:  - PRBC for hgb </= 6.9gm%    - exceptions are   -  if ACS susepcted/confirmed then transfuse for hgb </= 8.0gm%,  or    -  active bleeding with hemodynamic instability, then transfuse regardless of hemoglobin value   At at all times try to transfuse 1 unit prbc  as possible with exception of active hemorrhage      INfectiOUS A:   No obvious infection P:   Follow  ENDOCRINE A:   No prior endocrine disorders   P:   Status     FAMILY  - Updates: daughter updated   Plan to  extubated   Dr. Brand Males, M.D., [facility].C.P Pulmonary and Critical Care Medicine Staff Physician, [place] Director - Interstitial Lung Disease  Program  Pulmonary [place] at [place], [place], [address]  Pager: [phone], If no answer or between  15:00h - 7:00h: call [phone] Telephone: [phone]

## 2018-05-14 NOTE — Progress Notes (Signed)
[facility] Progress Note Patient Name: LILIT CINELLI DOB: 1961/02/27 MRN: [id number]   Date of Service  05/14/2018  HPI/Events of Note  Elevated HR and BP - BP 175/85 and HR = 117. Related to pain?  eICU Interventions  Will order: 1. Increase Fentanyl to 100 mcg IV Q 1 hour PRN pain.      Intervention Category Major Interventions: Hypertension - evaluation and management  Sommer,Steven Eugene 05/14/2018, 12:24 AM

## 2018-05-15 ENCOUNTER — Encounter (HOSPITAL_COMMUNITY): Payer: Self-pay | Admitting: Gynecologic Oncology

## 2018-05-15 ENCOUNTER — Ambulatory Visit: Payer: 59 | Admitting: Obstetrics

## 2018-05-15 LAB — CBC WITH DIFFERENTIAL/PLATELET
Abs Immature Granulocytes: 0 10*3/uL (ref 0.0–0.1)
Basophils Absolute: 0 10*3/uL (ref 0.0–0.1)
Basophils Relative: 0 %
EOS PCT: 0 %
Eosinophils Absolute: 0 10*3/uL (ref 0.0–0.7)
HEMATOCRIT: 33.6 % — AB (ref 36.0–46.0)
Hemoglobin: 11 g/dL — ABNORMAL LOW (ref 12.0–15.0)
Immature Granulocytes: 0 %
LYMPHS ABS: 1.6 10*3/uL (ref 0.7–4.0)
LYMPHS PCT: 19 %
MCH: 30.2 pg (ref 26.0–34.0)
MCHC: 32.7 g/dL (ref 30.0–36.0)
MCV: 92.3 fL (ref 78.0–100.0)
MONO ABS: 0.7 10*3/uL (ref 0.1–1.0)
MONOS PCT: 8 %
Neutro Abs: 6.4 10*3/uL (ref 1.7–7.7)
Neutrophils Relative %: 73 %
PLATELETS: 126 10*3/uL — AB (ref 150–400)
RBC: 3.64 MIL/uL — AB (ref 3.87–5.11)
RDW: 15.5 % (ref 11.5–15.5)
WBC: 8.8 10*3/uL (ref 4.0–10.5)

## 2018-05-15 LAB — BASIC METABOLIC PANEL
Anion gap: 3 — ABNORMAL LOW (ref 5–15)
BUN: 9 mg/dL (ref 6–20)
CALCIUM: 7.5 mg/dL — AB (ref 8.9–10.3)
CO2: 29 mmol/L (ref 22–32)
CREATININE: 0.6 mg/dL (ref 0.44–1.00)
Chloride: 110 mmol/L (ref 98–111)
GFR calc Af Amer: >60 mL/min (ref >60)
GFR calc non Af Amer: >60 mL/min (ref >60)
GLUCOSE: 94 mg/dL (ref 70–99)
Potassium: 3.8 mmol/L (ref 3.5–5.1)
Sodium: 142 mmol/L (ref 135–145)

## 2018-05-15 LAB — PHOSPHORUS: Phosphorus: 2.3 mg/dL — ABNORMAL LOW (ref 2.5–4.6)

## 2018-05-15 LAB — MAGNESIUM: Magnesium: 2.5 mg/dL — ABNORMAL HIGH (ref 1.7–2.4)

## 2018-05-15 LAB — BLOOD PRODUCT ORDER (VERBAL) VERIFICATION

## 2018-05-15 MED ORDER — TRAZODONE HCL 100 MG PO TABS
100.0000 mg | ORAL_TABLET | Freq: Every day | ORAL | Status: DC
  Administered 2018-05-15: 100 mg via ORAL
  Filled 2018-05-15: qty 1

## 2018-05-15 MED ORDER — IBUPROFEN 400 MG PO TABS
400.0000 mg | ORAL_TABLET | Freq: Four times a day (QID) | ORAL | Status: DC | PRN
  Administered 2018-05-16: 400 mg via ORAL
  Filled 2018-05-15: qty 1

## 2018-05-15 MED ORDER — ALPRAZOLAM 0.5 MG PO TABS
0.5000 mg | ORAL_TABLET | Freq: Three times a day (TID) | ORAL | Status: DC | PRN
  Administered 2018-05-15: 0.5 mg via ORAL
  Filled 2018-05-15: qty 1

## 2018-05-15 MED ORDER — IBUPROFEN 200 MG PO TABS
400.0000 mg | ORAL_TABLET | Freq: Once | ORAL | Status: AC
  Administered 2018-05-15: 400 mg via ORAL
  Filled 2018-05-15: qty 2

## 2018-05-15 MED ORDER — DULOXETINE HCL 60 MG PO CPEP
120.0000 mg | ORAL_CAPSULE | Freq: Every day | ORAL | Status: DC
  Administered 2018-05-15 – 2018-05-16 (×2): 120 mg via ORAL
  Filled 2018-05-15 (×2): qty 2

## 2018-05-15 MED ORDER — ACETAMINOPHEN 500 MG PO TABS
1000.0000 mg | ORAL_TABLET | Freq: Three times a day (TID) | ORAL | Status: DC
  Administered 2018-05-15 – 2018-05-16 (×3): 1000 mg via ORAL
  Filled 2018-05-15 (×3): qty 2

## 2018-05-15 MED ORDER — TRAMADOL HCL 50 MG PO TABS: 50.0000 mg | ORAL_TABLET | Freq: Four times a day (QID) | ORAL | Status: DC | PRN

## 2018-05-15 NOTE — Progress Notes (Signed)
PULMONARY / CRITICAL CARE MEDICINE   Name: Jacqueline Castro MRN: [id number] DOB: [birth year]    ADMISSION DATE:  05/12/2018 CONSULTATION DATE: 05/14/18      HISTORY OF PRESENT ILLNESS:    Ms.Ashleyis a 57 y.o.femalewith medical history significant ofulcerative colitis, anxiety, anemia who presented to th ED 05/12/2018 for LLQ abdominal pain. She notes that the pain has been present off and on for about 2 months, but on the AM of admission it started and was much more severe and constant. Pain is 10/10 causing her to feel she has to move constantly, sharp and throbbing. She has had morphine, dilaudid, toradol and fentanyl in the ED without relief. Rest does not make it better. She has had bloating as well that she only noticed the first time this morning. She has had normal BM and no constipation or blood loss of which she is aware.    ED Course:In the ED, she was noted to have severe pain, elevated BP at times, relatively normal blood work, but a CT abdomen pelvis showed a large pelvic mass and ascites, likely malignant. The ED discussed with gynecology/oncology and she was given an appointment for Monday. She had persistent pain despite treatment and was admitted for pain control.   This AM 05/13/18 - the patient became hypotensive to 65/52. She received 1L NS with minimal improvement. A repeat CBC showed that her Hb had fallen from 13.8 on admission to 6.9. She reports no recent hx of melena, hematechezia or hematemesis. Other than UC there is no hx PUD. Upon CCM  arrival to the bedside the patient was receiving a 2nd liter of NS + neosynephrine, yet still with a BP of 78/44 (MAP 55). The patient is being accepted in transfer to the ICU for management of hypotension.  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 R/o GIB  EVENTS 05/13/18 -  Rx yesterday for hemorrhagic shock. Rt scv line placed. 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  8/5 The patient was extubted yesterday and has done well hjer respiratory status is good. No major complaints. She is tolerating po meds and food by mouth. Not on any drips at this time   PAST MEDICAL HISTORY :  She  has a past medical history of Anemia, Anxiety, Chest pain, Palpitations, Proctitis, and Ulcerative colitis.  PAST SURGICAL HISTORY: She  has a past surgical history that includes Tubal ligation; Lumbar disc surgery; Cesarean section; and laparotomy (N/A, 05/13/2018).  Allergies  Allergen Reactions  . Codeine     REACTION: vomiting    No current facility-administered medications on file prior to encounter.    Current Outpatient Medications on File Prior to Encounter  Medication Sig  . ALPRAZolam (XANAX) 0.5 MG tablet Take 0.5 mg by mouth 3 (three) times daily as needed for anxiety.   . DULoxetine (CYMBALTA) 30 MG capsule Take 120 mg by mouth daily.   . traZODone (DESYREL) 50 MG tablet Take 100 mg by mouth at bedtime.  . pantoprazole (PROTONIX) 20 MG tablet Take 1 tablet (20 mg total) by mouth daily. (Patient not taking: Reported on 05/12/2018)    FAMILY HISTORY:  Her family history includes Colon cancer (age of onset: 66) in her paternal grandfather and paternal uncle;  Diabetes in her mother; Heart Problems in her sister.  SOCIAL HISTORY: She  reports that she has never smoked. She has never used smokeless tobacco. She reports that she does not drink alcohol or use drugs.    VITAL SIGNS: BP 127/72   Pulse 72   Temp 98.7 F (37.1 C)   Resp 15   Ht 5' 4"  (1.626 m)   Wt 139 lb 12.4 oz (63.4 kg)   LMP 08/29/2011   SpO2 94%   BMI 23.99 kg/m   HEMODYNAMICS: CVP:  [2 mmHg-16 mmHg] 3 mmHg  VENTILATOR SETTINGS: Vent Mode: PSV;CPAP PEEP:  [5 cmH20] 5 cmH20 Pressure Support:   [5 cmH20] 5 cmH20  INTAKE / OUTPUT: I/O last 3 completed shifts: In: 7042.3 [I.V.:2679; Blood:3727; IV Piggyback:636.3] Out: 8080 [Urine:4430; Blood:3650]  PHYSICAL EXAMINATION: General:  Awake and alert Neuro:Moving alll extr. Speech is fluent and follows commands HEENT: [place]/AT, oral p[hyarynx normal Cardiovascular: RRR, s1S2 Lungs:  Clear to A and P Abdomen: soft, BS, non-tender  Skin:  Warm and dry  LABS:  BMET Recent Labs  Lab 05/13/18 0544  05/13/18 2039 05/14/18 0422 05/15/18 0229  NA 132*   < > 134* 136 142  K 4.5   < > 4.9 4.2 3.8  CL 99  --   --  105 110  CO2 22  --   --  26 29  BUN 20  --   --  17 9  CREATININE 1.75*  --   --  0.97 0.60  GLUCOSE 189*  --   --  128* 94   < > = values in this interval not displayed.    Electrolytes Recent Labs  Lab 05/13/18 0544 05/14/18 0422 05/15/18 0229  CALCIUM 8.3* 7.8* 7.5*  MG  --  1.6* 2.5*  PHOS  --  2.7 2.3*    CBC Recent Labs  Lab 05/13/18 0544  05/13/18 1914  05/13/18 2039 05/14/18 0422 05/15/18 0229  WBC 15.4*  --   --   --   --  11.7* 8.8  HGB 6.9*   < >  --    < > 11.2* 11.8* 11.0*  HCT 21.3*   < >  --    < > 33.0* 33.8* 33.6*  PLT 197  --  68*  --   --  140* 126*   < > = values in this interval not displayed.    Coag's Recent Labs  Lab 05/13/18 0821 05/13/18 1610 05/13/18 1914  APTT 29 28 31   INR 1.36 1.36 1.35    Sepsis Markers No results for input(s): LATICACIDVEN, PROCALCITON, O2SATVEN in the last 168 hours.  ABG Recent Labs  Lab 05/13/18 2152 05/13/18 2342 05/14/18 1459  PHART 7.247* 7.352 7.362  PCO2ART 57.4* 43.3 47.2  PO2ART 213.0* 300.0* 125.0*    Liver Enzymes Recent Labs  Lab 05/12/18 0521 05/13/18 0544  AST 43* 43*  ALT 17 14  ALKPHOS 41 29*  BILITOT 0.7 0.5  ALBUMIN 3.8 3.2*    Cardiac Enzymes No results for input(s): TROPONINI, PROBNP in the last 168 hours.  Glucose Recent Labs  Lab 05/13/18 0522  GLUCAP 165*    Imaging No results  found.    DISCUSSION: The patient is s/p TAH, bilaterla salpingo-oophrectomy, omentectomy. It appears that the patient has recovered nicely from her surgery. She is eating and drinking. Has been hemodynamically stable. Last Hb was 11. Renal fn. Is normal. Her respiratory status is noraml presently. She is in a NSR. She  is not on any vasoactive meds. It appears that she can be transferred to the medical floor.     Micheal Likens MD Pulmonary and [place] Pager: [phone]  05/15/2018, 12:52 PM

## 2018-05-15 NOTE — Progress Notes (Signed)
[facility] Progress Note Patient Name: Jacqueline Castro DOB: 09-10-1961 MRN: [id number]   Date of Service  05/15/2018  HPI/Events of Note  Patient c/o headache. On sips and chips. Creatinine = 0.60.   eICU Interventions  Will order: 1. Motrin 400 mg PO X 1.      Intervention Category Intermediate Interventions: Pain - evaluation and management  Rashonda Warrior Eugene 05/15/2018, 5:53 AM

## 2018-05-15 NOTE — Progress Notes (Addendum)
2 Days Post-Op Procedure(s) (LRB): EXPLORATORY LAPAROTOMY, TOTAL ABDOMINAL HYSTERECTOMY, BILATERAL SALPINGO-OOPHORECTOMY, OMENTECTOMY (N/A)  Subjective: Patient reports tolerating solid food and liquids with no nausea or emesis.  Up to the chair without difficulty.  Passing flatus.  Stating her abdominal pain is "not bad."  Intermittent shaking reported by patient's daughter that resolves after a few seconds.  She states the patient mentioned that sometimes this can happen if she has not had her cymbalta.  Patient wanting her trazadone ordered at bedtime. Denies chest pain, dyspnea, chills/feelings of fever.  Daughter at bedside asking about next steps.  Objective: Vital signs in last 24 hours: Temp:  [97.7 F (36.5 C)-99.1 F (37.3 C)] 98.7 F (37.1 C) (08/05 0700) Pulse Rate:  [70-101] 72 (08/05 0700) Resp:  [13-26] 15 (08/05 0700) BP: (82-127)/(48-85) 127/72 (08/05 0700) SpO2:  [90 %-100 %] 94 % (08/05 0700) Arterial Line BP: (78-131)/(53-78) 131/65 (08/04 1700) Last BM Date: 05/11/18  Intake/Output from previous day: 08/04 0701 - 08/05 0700 In: 1328.2 [I.V.:828.2; IV Piggyback:500] Out: 3100 [Urine:3100]  Physical Examination: General: alert, cooperative and no distress Resp: clear to auscultation bilaterally Cardio: irregularly irregular rhythm GI: incision: midline incision with op site dressing in place with no active drainage and abdomen soft, active bowel sounds noted, non-tympanic Extremities: extremities normal, atraumatic, no cyanosis or edema HR at 69 bpm.  Labs: WBC/Hgb/Hct/Plts:  8.8/11.0/33.6/126 (08/05 0229) BUN/Cr/glu/ALT/AST/amyl/lip:  9/0.60/--/--/--/--/-- (08/05 0229)  Assessment: 57 y.o. s/p Procedure(s): EXPLORATORY LAPAROTOMY, TOTAL ABDOMINAL HYSTERECTOMY, BILATERAL SALPINGO-OOPHORECTOMY, OMENTECTOMY: stable Pain:  Pain is well-controlled on PRN medications.  Heme: Hgb 11.0 and Hct 33.6 this am.  Continue to monitor.  S/P 5 units PRBC intraop, 2 units  FFP, and 2 units platelets.  ID: WBC improving, 8.8 this am.  Plan for repeat in the am.  CV: Continue to monitor with telemetry.  Hx palpitations in the past.   GI:  Tolerating po: Yes.  Diet to regular as tolerated.     GU: Due to void since foley removal.  Adequate output reported. Creatinine 0.60 this am.    FEN: Appropriate. Bmet with Mag and Phos checked at 2:30am.    Prophylaxis: SCDs ordered.  Plan: Transfer to medical floor with telemetry Home meds reordered IV Fentanyl discontinued and tramadol ordered PRN Encourage ambulation, IS use, deep breathing, and coughing Continue plan of care per Dr. Gerarda Fraction, Dr. Denman George Appreciate Pulm/Critical Care Management    LOS: 2 days    Jacqueline Castro 05/15/2018, 12:25 PM  Patient seen and examined. Has been up in room to bathroom. No complaints currently.  Pain - added scheduled Tylenol as Ofirmev has run completed course; PRN Advil DVT proph - SCD and ambulation for now

## 2018-05-16 ENCOUNTER — Emergency Department (HOSPITAL_COMMUNITY): Payer: 59

## 2018-05-16 ENCOUNTER — Other Ambulatory Visit: Payer: Self-pay

## 2018-05-16 ENCOUNTER — Encounter (HOSPITAL_COMMUNITY): Payer: Self-pay

## 2018-05-16 ENCOUNTER — Inpatient Hospital Stay (HOSPITAL_COMMUNITY)
Admission: EM | Admit: 2018-05-16 | Discharge: 2018-05-20 | DRG: 919 | Disposition: A | Discharge status: Home / Self Care | Payer: 59 | Attending: Obstetrics & Gynecology | Admitting: Obstetrics & Gynecology

## 2018-05-16 DIAGNOSIS — Z79891 Long term (current) use of opiate analgesic: Secondary | ICD-10-CM

## 2018-05-16 DIAGNOSIS — J9600 Acute respiratory failure, unspecified whether with hypoxia or hypercapnia: Secondary | ICD-10-CM | POA: Diagnosis present

## 2018-05-16 DIAGNOSIS — N99820 Postprocedural hemorrhage and hematoma of a genitourinary system organ or structure following a genitourinary system procedure: Secondary | ICD-10-CM | POA: Diagnosis not present

## 2018-05-16 DIAGNOSIS — Z79899 Other long term (current) drug therapy: Secondary | ICD-10-CM

## 2018-05-16 DIAGNOSIS — F419 Anxiety disorder, unspecified: Secondary | ICD-10-CM | POA: Diagnosis present

## 2018-05-16 DIAGNOSIS — E861 Hypovolemia: Secondary | ICD-10-CM

## 2018-05-16 DIAGNOSIS — E876 Hypokalemia: Secondary | ICD-10-CM | POA: Diagnosis present

## 2018-05-16 DIAGNOSIS — R579 Shock, unspecified: Secondary | ICD-10-CM

## 2018-05-16 DIAGNOSIS — I959 Hypotension, unspecified: Secondary | ICD-10-CM | POA: Diagnosis present

## 2018-05-16 DIAGNOSIS — D500 Iron deficiency anemia secondary to blood loss (chronic): Secondary | ICD-10-CM | POA: Diagnosis present

## 2018-05-16 DIAGNOSIS — I9589 Other hypotension: Secondary | ICD-10-CM | POA: Diagnosis not present

## 2018-05-16 DIAGNOSIS — R578 Other shock: Secondary | ICD-10-CM | POA: Diagnosis present

## 2018-05-16 DIAGNOSIS — M25552 Pain in left hip: Secondary | ICD-10-CM | POA: Diagnosis present

## 2018-05-16 DIAGNOSIS — D62 Acute posthemorrhagic anemia: Secondary | ICD-10-CM

## 2018-05-16 DIAGNOSIS — E872 Acidosis, unspecified: Secondary | ICD-10-CM

## 2018-05-16 DIAGNOSIS — K51911 Ulcerative colitis, unspecified with rectal bleeding: Secondary | ICD-10-CM | POA: Diagnosis present

## 2018-05-16 DIAGNOSIS — K661 Hemoperitoneum: Secondary | ICD-10-CM | POA: Diagnosis present

## 2018-05-16 DIAGNOSIS — R58 Hemorrhage, not elsewhere classified: Secondary | ICD-10-CM

## 2018-05-16 DIAGNOSIS — K5190 Ulcerative colitis, unspecified, without complications: Secondary | ICD-10-CM | POA: Diagnosis present

## 2018-05-16 LAB — PREPARE FRESH FROZEN PLASMA
UNIT DIVISION: 0
UNIT DIVISION: 0
UNIT DIVISION: 0
UNIT DIVISION: 0
UNIT DIVISION: 0
Unit division: 0
Unit division: 0
Unit division: 0
Unit division: 0
Unit division: 0

## 2018-05-16 LAB — CBC WITH DIFFERENTIAL/PLATELET
ABS IMMATURE GRANULOCYTES: 0 10*3/uL (ref 0.0–0.1)
Abs Immature Granulocytes: 0.2 10*3/uL — ABNORMAL HIGH (ref 0.0–0.1)
BASOS PCT: 0 %
Basophils Absolute: 0 10*3/uL (ref 0.0–0.1)
Basophils Absolute: 0 10*3/uL (ref 0.0–0.1)
Basophils Relative: 0 %
EOS ABS: 0 10*3/uL (ref 0.0–0.7)
Eosinophils Absolute: 0.1 10*3/uL (ref 0.0–0.7)
Eosinophils Relative: 1 %
Eosinophils Relative: 0 %
HCT: 34 % — ABNORMAL LOW (ref 36.0–46.0)
HEMATOCRIT: 24.1 % — AB (ref 36.0–46.0)
HEMOGLOBIN: 11.5 g/dL — AB (ref 12.0–15.0)
Hemoglobin: 8 g/dL — ABNORMAL LOW (ref 12.0–15.0)
IMMATURE GRANULOCYTES: 1 %
Immature Granulocytes: 1 %
LYMPHS ABS: 1.1 10*3/uL (ref 0.7–4.0)
LYMPHS PCT: 18 %
LYMPHS PCT: 7 %
Lymphs Abs: 1.3 10*3/uL (ref 0.7–4.0)
MCH: 30.7 pg (ref 26.0–34.0)
MCH: 30.8 pg (ref 26.0–34.0)
MCHC: 33.8 g/dL (ref 30.0–36.0)
MCHC: 33.2 g/dL (ref 30.0–36.0)
MCV: 90.9 fL (ref 78.0–100.0)
MCV: 92.7 fL (ref 78.0–100.0)
Monocytes Absolute: 0.5 10*3/uL (ref 0.1–1.0)
Monocytes Absolute: 0.7 10*3/uL (ref 0.1–1.0)
Monocytes Relative: 7 %
Monocytes Relative: 5 %
NEUTROS ABS: 5.4 10*3/uL (ref 1.7–7.7)
NEUTROS PCT: 73 %
Neutro Abs: 13.2 10*3/uL — ABNORMAL HIGH (ref 1.7–7.7)
Neutrophils Relative %: 87 %
PLATELETS: 130 10*3/uL — AB (ref 150–400)
Platelets: 174 10*3/uL (ref 150–400)
RBC: 3.74 MIL/uL — AB (ref 3.87–5.11)
RBC: 2.6 MIL/uL — AB (ref 3.87–5.11)
RDW: 14.2 % (ref 11.5–15.5)
RDW: 13.7 % (ref 11.5–15.5)
WBC: 7.4 10*3/uL (ref 4.0–10.5)
WBC: 15.2 10*3/uL — ABNORMAL HIGH (ref 4.0–10.5)

## 2018-05-16 LAB — BPAM RBC
BLOOD PRODUCT EXPIRATION DATE: 201908102359
BLOOD PRODUCT EXPIRATION DATE: 201908122359
BLOOD PRODUCT EXPIRATION DATE: 201908202359
BLOOD PRODUCT EXPIRATION DATE: 201908202359
BLOOD PRODUCT EXPIRATION DATE: 201909032359
Blood Product Expiration Date: 201908112359
Blood Product Expiration Date: 201908182359
Blood Product Expiration Date: 201908182359
Blood Product Expiration Date: 201908192359
Blood Product Expiration Date: 201908202359
Blood Product Expiration Date: 201909012359
Blood Product Expiration Date: 201909032359
Blood Product Expiration Date: 201909072359
Blood Product Expiration Date: 201909072359
Blood Product Expiration Date: 201909072359
Blood Product Expiration Date: 201909082359
Blood Product Expiration Date: 201909082359
Blood Product Expiration Date: 201909122359
Blood Product Expiration Date: 201909122359
ISSUE DATE / TIME: 201908031749
ISSUE DATE / TIME: 201908031749
ISSUE DATE / TIME: 201908031859
ISSUE DATE / TIME: 201908031859
ISSUE DATE / TIME: 201908031906
ISSUE DATE / TIME: 201908060928
ISSUE DATE / TIME: 201908060928
ISSUE DATE / TIME: 201908060928
ISSUE DATE / TIME: 201907301525
ISSUE DATE / TIME: 201908030701
ISSUE DATE / TIME: 201908030701
ISSUE DATE / TIME: 201908031649
ISSUE DATE / TIME: 201908031749
ISSUE DATE / TIME: 201908031749
ISSUE DATE / TIME: 201908031859
ISSUE DATE / TIME: 201908031859
UNIT TYPE AND RH: 1700
UNIT TYPE AND RH: 1700
UNIT TYPE AND RH: 1700
UNIT TYPE AND RH: 1700
UNIT TYPE AND RH: 1700
UNIT TYPE AND RH: 1700
UNIT TYPE AND RH: 1700
UNIT TYPE AND RH: 1700
UNIT TYPE AND RH: 1700
UNIT TYPE AND RH: 1700
UNIT TYPE AND RH: 9500
UNIT TYPE AND RH: 9500
UNIT TYPE AND RH: 9500
Unit Type and Rh: 1700
Unit Type and Rh: 1700
Unit Type and Rh: 1700
Unit Type and Rh: 9500
Unit Type and Rh: 9500
Unit Type and Rh: 9500

## 2018-05-16 LAB — TYPE AND SCREEN
ABO/RH(D): B NEG
ANTIBODY SCREEN: NEGATIVE
UNIT DIVISION: 0
UNIT DIVISION: 0
UNIT DIVISION: 0
UNIT DIVISION: 0
UNIT DIVISION: 0
UNIT DIVISION: 0
UNIT DIVISION: 0
Unit division: 0
Unit division: 0
Unit division: 0
Unit division: 0
Unit division: 0
Unit division: 0
Unit division: 0
Unit division: 0
Unit division: 0
Unit division: 0
Unit division: 0
Unit division: 0

## 2018-05-16 LAB — BPAM FFP
BLOOD PRODUCT EXPIRATION DATE: 201908082359
BLOOD PRODUCT EXPIRATION DATE: 201908082359
BLOOD PRODUCT EXPIRATION DATE: 201908082359
BLOOD PRODUCT EXPIRATION DATE: 201908082359
BLOOD PRODUCT EXPIRATION DATE: 201908082359
BLOOD PRODUCT EXPIRATION DATE: 201908082359
BLOOD PRODUCT EXPIRATION DATE: 201908082359
BLOOD PRODUCT EXPIRATION DATE: 201908082359
Blood Product Expiration Date: 201908082359
Blood Product Expiration Date: 201908082359
Blood Product Expiration Date: 201908082359
Blood Product Expiration Date: 201908082359
ISSUE DATE / TIME: 201908031826
ISSUE DATE / TIME: 201908031826
ISSUE DATE / TIME: 201908031928
ISSUE DATE / TIME: 201908031826
ISSUE DATE / TIME: 201908031826
ISSUE DATE / TIME: 201908031928
ISSUE DATE / TIME: 201908031928
ISSUE DATE / TIME: 201908031928
UNIT TYPE AND RH: 1700
UNIT TYPE AND RH: 7300
UNIT TYPE AND RH: 7300
UNIT TYPE AND RH: 7300
UNIT TYPE AND RH: 7300
Unit Type and Rh: 1700
Unit Type and Rh: 1700
Unit Type and Rh: 7300
Unit Type and Rh: 7300
Unit Type and Rh: 7300
Unit Type and Rh: 7300
Unit Type and Rh: 7300

## 2018-05-16 LAB — I-STAT CG4 LACTIC ACID, ED
LACTIC ACID, VENOUS: 7.07 mmol/L — AB (ref 0.5–1.9)
LACTIC ACID, VENOUS: 1.6 mmol/L (ref 0.5–1.9)

## 2018-05-16 LAB — COMPREHENSIVE METABOLIC PANEL
ALT: 50 U/L — ABNORMAL HIGH (ref 0–44)
AST: 91 U/L — ABNORMAL HIGH (ref 15–41)
Albumin: 2.6 g/dL — ABNORMAL LOW (ref 3.5–5.0)
Alkaline Phosphatase: 49 U/L (ref 38–126)
Anion gap: 15 (ref 5–15)
BUN: 6 mg/dL (ref 6–20)
CHLORIDE: 105 mmol/L (ref 98–111)
CO2: 18 mmol/L — ABNORMAL LOW (ref 22–32)
CREATININE: 0.94 mg/dL (ref 0.44–1.00)
Calcium: 7.9 mg/dL — ABNORMAL LOW (ref 8.9–10.3)
GFR calc Af Amer: >60 mL/min (ref >60)
Glucose, Bld: 265 mg/dL — ABNORMAL HIGH (ref 70–99)
Potassium: 3.4 mmol/L — ABNORMAL LOW (ref 3.5–5.1)
Sodium: 138 mmol/L (ref 135–145)
Total Bilirubin: 0.7 mg/dL (ref 0.3–1.2)
Total Protein: 4.7 g/dL — ABNORMAL LOW (ref 6.5–8.1)

## 2018-05-16 LAB — I-STAT CHEM 8, ED
BUN: 4 mg/dL — ABNORMAL LOW (ref 6–20)
CHLORIDE: 105 mmol/L (ref 98–111)
CREATININE: 0.6 mg/dL (ref 0.44–1.00)
Calcium, Ion: 0.97 mmol/L — ABNORMAL LOW (ref 1.15–1.40)
Glucose, Bld: 279 mg/dL — ABNORMAL HIGH (ref 70–99)
HCT: 29 % — ABNORMAL LOW (ref 36.0–46.0)
Hemoglobin: 9.9 g/dL — ABNORMAL LOW (ref 12.0–15.0)
Potassium: 3.2 mmol/L — ABNORMAL LOW (ref 3.5–5.1)
Sodium: 138 mmol/L (ref 135–145)
TCO2: 19 mmol/L — ABNORMAL LOW (ref 22–32)

## 2018-05-16 LAB — MAGNESIUM: Magnesium: 2 mg/dL (ref 1.7–2.4)

## 2018-05-16 LAB — I-STAT TROPONIN, ED: TROPONIN I, POC: 0.05 ng/mL (ref 0.00–0.08)

## 2018-05-16 LAB — PHOSPHORUS: Phosphorus: 2.1 mg/dL — ABNORMAL LOW (ref 2.5–4.6)

## 2018-05-16 MED ORDER — LACTATED RINGERS IV BOLUS
1000.0000 mL | Freq: Once | INTRAVENOUS | Status: AC
  Administered 2018-05-16: 1000 mL via INTRAVENOUS

## 2018-05-16 MED ORDER — VANCOMYCIN HCL IN DEXTROSE 1-5 GM/200ML-% IV SOLN
1000.0000 mg | Freq: Once | INTRAVENOUS | Status: AC
  Administered 2018-05-16: 1000 mg via INTRAVENOUS
  Filled 2018-05-16: qty 200

## 2018-05-16 MED ORDER — FENTANYL CITRATE (PF) 100 MCG/2ML IJ SOLN
50.0000 ug | Freq: Once | INTRAMUSCULAR | Status: AC
  Administered 2018-05-17: 50 ug via INTRAVENOUS
  Filled 2018-05-16: qty 2

## 2018-05-16 MED ORDER — ACETAMINOPHEN 500 MG PO TABS: 1000.0000 mg | ORAL_TABLET | Freq: Three times a day (TID) | ORAL | 0 refills | Status: DC

## 2018-05-16 MED ORDER — IBUPROFEN 400 MG PO TABS
400.0000 mg | ORAL_TABLET | Freq: Four times a day (QID) | ORAL | 0 refills | Status: DC | PRN
Start: 2018-05-16 — End: 2018-05-20

## 2018-05-16 MED ORDER — SODIUM CHLORIDE 0.9 % IV BOLUS
1000.0000 mL | Freq: Once | INTRAVENOUS | Status: AC
Start: 2018-05-16 — End: 2018-05-17
  Administered 2018-05-17: 1000 mL via INTRAVENOUS

## 2018-05-16 MED ORDER — IOHEXOL 300 MG/ML  SOLN
100.0000 mL | Freq: Once | INTRAMUSCULAR | Status: AC | PRN
  Administered 2018-05-16: 100 mL via INTRAVENOUS

## 2018-05-16 MED ORDER — OXYCODONE-ACETAMINOPHEN 5-325 MG PO TABS: 1.0000 | ORAL_TABLET | ORAL | 0 refills | Status: DC | PRN

## 2018-05-16 MED ORDER — LACTATED RINGERS IV SOLN
INTRAVENOUS | Status: DC
  Administered 2018-05-17: 02:00:00 via INTRAVENOUS

## 2018-05-16 MED ORDER — SODIUM CHLORIDE 0.9% IV SOLUTION: Freq: Once | INTRAVENOUS | Status: DC

## 2018-05-16 MED ORDER — SENNA 8.6 MG PO TABS: 1.0000 | ORAL_TABLET | Freq: Every day | ORAL | 0 refills | Status: DC

## 2018-05-16 MED ORDER — PIPERACILLIN-TAZOBACTAM 3.375 G IVPB 30 MIN
3.3750 g | Freq: Once | INTRAVENOUS | Status: AC
  Administered 2018-05-16: 3.375 g via INTRAVENOUS
  Filled 2018-05-16: qty 50

## 2018-05-16 NOTE — Discharge Instructions (Signed)
05/16/2018  Return to work: 4-6 weeks  Activity: 1. Be up and out of the bed during the day.  Take a nap if needed.  You may walk up steps but be careful and use the hand rail.  Stair climbing will tire you more than you think, you may need to stop part way and rest.   2. No lifting or straining for 6 weeks.  3. No driving for 1 weeks.  Do Not drive if you are taking narcotic pain medicine.  4. Shower daily.  Use soap and water on your incision and pat dry; don't rub.   5. No sexual activity and nothing in the vagina for 6 weeks.  Medications:  - Take ibuprofen and tylenol first line for pain control. Take these regularly (every 6 hours) to decrease the build up of pain.  - If necessary, for severe pain not relieved by ibuprofen, take percocet.  - While taking percocet you should take sennakot every night to reduce the likelihood of constipation. If this causes diarrhea, stop its use.  Diet: 1. Low sodium Heart Healthy Diet is recommended.  2. It is safe to use a laxative if you have difficulty moving your bowels.   Wound Care: 1. Keep clean and dry.  Shower daily. 2. You can take your dressing off 1 week after surgery (05/20/18). It can get wet in the shower (including after the dressing is removed). Avoid tub baths or pools/hot tubs for at least 1 month.   Reasons to call the Doctor:   Fever - Oral temperature greater than 100.4 degrees Fahrenheit  Foul-smelling vaginal discharge  Difficulty urinating  Nausea and vomiting  Increased pain at the site of the incision that is unrelieved with pain medicine.  Difficulty breathing with or without chest pain  New calf pain especially if only on one side  Sudden, continuing increased vaginal bleeding with or without clots.   Follow-up: 1. See Everitt Amber in 3 weeks at the [facility] (ground floor).  Contacts: For questions or concerns you should contact:  Dr. Everitt Amber at [phone] After hours and on  week-ends call [phone] and ask to speak to the physician on call for Gynecologic Oncology

## 2018-05-16 NOTE — Progress Notes (Signed)
Patient was given discharge instructions. Patient verbalized understanding and aware of follow up appointments. Patient left unit in stable condition.

## 2018-05-16 NOTE — ED Triage Notes (Signed)
Patient discharged from [facility] after hysterectomy today.  Initially EMS called out for Cardiac Arrest but patient was able to be aroused on arrival by EMS. Complains of knee pain.  IO and PIV started by EMS gave approximately 700cc given by EMS.  A&Ox4.

## 2018-05-16 NOTE — Discharge Summary (Signed)
Physician Discharge Summary  Patient ID: Jacqueline Castro MRN: [id number] DOB/AGE: February 05, 1961 57 y.o.  Admit date: 05/12/2018 Discharge date: 05/16/2018  Admission Diagnoses: Ovarian mass  Discharge Diagnoses:  Principal Problem:   Ovarian mass Active Problems:   Ulcerative proctitis, chronic (HCC)   Hypotension due to blood loss   Pelvic mass   Pelvic mass in female   Hemorrhagic shock ([place])   Acute respiratory failure [facility])   Discharged Condition: good  Hospital Course:  1/ patient was admitted on 01/10/18 for a pelvic mass and pain.  2/ surgery was performed urgently on the evening of 01/11/18 due to intraperitoneal hemorrhage (Hb dropped to 52m/dL) and pain. CT angiogram suggested bleeding from pelvic mass. She was transfused 4 units preop and then taken to the OR for an ex lap, TAH, BSO, omentectomy. Findings were significant for a 20cm left ovarian solid mass that had undergone torsion. It had spontaneously ruptured preop and was bleeding.  She received multiple units of FFP, PRBC and platelets intraop to resuscitate preop losses.  Her postop Hb was 173mdL. She was transferred to the unit intubated due to massive transfusion and concern for airway edema. 3/ on postoperative day 1 she was extubated. Her labs remained stable postop. On POD 2 she was transferred out of the unit, was voiding urine spontaneously. On POD3 the patient was meeting discharge criteria: tolerating PO, voiding urine, ambulating, pain well controlled on oral medications.  4/ new medications on discharge include sennakot, percocet, tylenol and ibuprofen. Lovenox was not ordered as the frozen section did not show definitive malignancy.  Consults: None  Significant Diagnostic Studies: labs:  CBC    Component Value Date/Time   WBC 7.4 05/16/2018 0446   RBC 3.74 (L) 05/16/2018 0446   HGB 11.5 (L) 05/16/2018 0446   HCT 34.0 (L) 05/16/2018 0446   PLT 130 (L) 05/16/2018 0446   MCV 90.9 05/16/2018 0446   MCH 30.7  05/16/2018 0446   MCHC 33.8 05/16/2018 0446   RDW 14.2 05/16/2018 0446   LYMPHSABS 1.3 05/16/2018 0446   MONOABS 0.5 05/16/2018 0446   EOSABS 0.1 05/16/2018 0446   BASOSABS 0.0 05/16/2018 0446   BMET    Component Value Date/Time   NA 142 05/15/2018 0229   K 3.8 05/15/2018 0229   CL 110 05/15/2018 0229   CO2 29 05/15/2018 0229   GLUCOSE 94 05/15/2018 0229   BUN 9 05/15/2018 0229   CREATININE 0.60 05/15/2018 0229   CALCIUM 7.5 (L) 05/15/2018 0229   GFRNONAA >60 05/15/2018 0229   GFRAA >60 05/15/2018 0229     Treatments: IV hydration, surgery: see above and transfusion, intubation/mechanical ventilation  Discharge Exam: Blood pressure (!) 147/91, pulse 68, temperature 98.9 F (37.2 C), temperature source Oral, resp. rate 17, height 5' 4"  (1.626 m), weight 139 lb 12.4 oz (63.4 kg), last menstrual period 08/29/2011, SpO2 92 %. General appearance: alert and cooperative GI: soft, non-tender; bowel sounds normal; no masses,  no organomegaly Incision/Wound: clean, dry and intact with some ecchymosis but no sign of infection.   Disposition: Discharge disposition: 01-Home or Self Care       Discharge Instructions    (HEART FAILURE PATIENTS) Call MD:  Anytime you have any of the following symptoms: 1) 3 pound weight gain in 24 hours or 5 pounds in 1 week 2) shortness of breath, with or without a dry hacking cough 3) swelling in the hands, feet or stomach 4) if you have to sleep on extra pillows at night  in order to breathe.   Complete by:  As directed    Call MD for:  difficulty breathing, headache or visual disturbances   Complete by:  As directed    Call MD for:  extreme fatigue   Complete by:  As directed    Call MD for:  hives   Complete by:  As directed    Call MD for:  persistant dizziness or light-headedness   Complete by:  As directed    Call MD for:  persistant nausea and vomiting   Complete by:  As directed    Call MD for:  redness, tenderness, or signs of infection  (pain, swelling, redness, odor or green/yellow discharge around incision site)   Complete by:  As directed    Call MD for:  severe uncontrolled pain   Complete by:  As directed    Call MD for:  temperature >100.4   Complete by:  As directed    Diet - low sodium heart healthy   Complete by:  As directed    Diet general   Complete by:  As directed    Driving Restrictions   Complete by:  As directed    No driving for 7 days or until off narcotic pain medication   Increase activity slowly   Complete by:  As directed    Remove dressing in 24 hours   Complete by:  As directed    Sexual Activity Restrictions   Complete by:  As directed    No intercourse for 6 weeks     Allergies as of 05/16/2018      Reactions   Codeine    REACTION: vomiting      Medication List    TAKE these medications   acetaminophen 500 MG tablet Commonly known as:  TYLENOL Take 2 tablets (1,000 mg total) by mouth every 8 (eight) hours.   ALPRAZolam 0.5 MG tablet Commonly known as:  XANAX Take 0.5 mg by mouth 3 (three) times daily as needed for anxiety.   DULoxetine 30 MG capsule Commonly known as:  CYMBALTA Take 120 mg by mouth daily.   ibuprofen 400 MG tablet Commonly known as:  ADVIL,MOTRIN Take 1 tablet (400 mg total) by mouth every 6 (six) hours as needed for moderate pain.   oxyCODONE-acetaminophen 5-325 MG tablet Commonly known as:  PERCOCET Take 1-2 tablets by mouth every 4 (four) hours as needed for severe pain.   pantoprazole 20 MG tablet Commonly known as:  PROTONIX Take 1 tablet (20 mg total) by mouth daily.   senna 8.6 MG Tabs tablet Commonly known as:  SENOKOT Take 1 tablet (8.6 mg total) by mouth at bedtime.   traZODone 50 MG tablet Commonly known as:  DESYREL Take 100 mg by mouth at bedtime.      Follow-up Information    Everitt Amber, MD Follow up in 3 week(s).   Specialty:  Gynecologic Oncology Contact information: [address] [place]  [address] [phone]           Signed: Thereasa Solo 05/16/2018, 6:59 AM

## 2018-05-16 NOTE — ED Provider Notes (Signed)
[facility] EMERGENCY DEPARTMENT Provider Note   CSN: [id number] Arrival date & time: 05/16/18  1905  History   Chief Complaint Chief Complaint  Patient presents with  . Hypotension  . Post-op Problem    HPI Jacqueline Castro is a 57 y.o. female.  HPI 57 year old female with history of ulcerative colitis and anxiety status post total hysterectomy on 8/3 for hemorrhagic ovarian mass presents to the emergency department today by EMS for evaluation of unresponsiveness and hypotension.  EMS was called to home reportedly for cardiac arrest however patient was arousable at the time of their arrival.  Had only faintly palpable pulses.  IV access was unable to be obtained therefore EMS placed right tibial IO and gave 700cc IVFs. Initial BP 40s/20s and improved to 65K systolic in route. Mental status improved with EMS and patient answering some questions.  Patient denies any significant abdominal pain though does have some tenderness around the incision site.  States she is had only scant vaginal bleeding since returning to home yesterday.  No fevers though temperature was reportedly 95 by EMS.  Patient reports feeling chills.  Not on antibiotics.  Has been taking only Tylenol and ibuprofen for pain at home.  Did not fill prescription for Percocet.  Patient also complaining of right knee pain which she attributes to the IO needle.  No leg swelling.  Denies any history of blood clots.  There was concern for the ovarian mass being possible malignancy however does not formally carry diagnosis of malignancy.  Review of surgery shows it was urgent/emergent. Required numerous blood products including >5units PRBCs, FFP, and platelets pre/post intraop. Has been having increased urinary frequency and thirst since surgery. No fevers at home. Not on antibiotics.   Past Medical History:  Diagnosis Date  . Anemia   . Anxiety   . Chest pain   . Palpitations   . Proctitis   . Ulcerative colitis       Patient Active Problem List   Diagnosis Date Noted  . Shock ([place]) 05/16/2018  . Hemorrhagic shock ([place])   . Acute respiratory failure ([place])   . Hypotension due to blood loss 05/13/2018  . Pelvic mass in female 05/13/2018  . Pelvic mass   . Ovarian mass 05/12/2018  . LLQ abdominal pain   . Palpitations 04/10/2014  . Chest pain 04/10/2014  . 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 ULCERATIVE PROCTITIS 02/23/2008  . IRRITABLE BOWEL SYNDROME 02/23/2008  . DIARRHEA, ACUTE, CHRONIC 02/23/2008  . RECTAL BLEEDING 08/11/2004    Past Surgical History:  Procedure Laterality Date  . CESAREAN SECTION    . LAPAROTOMY N/A 05/13/2018   Procedure: EXPLORATORY LAPAROTOMY, TOTAL ABDOMINAL HYSTERECTOMY, BILATERAL SALPINGO-OOPHORECTOMY, OMENTECTOMY;  Surgeon: Everitt Amber, MD;  Location: [facility];  Service: Gynecology;  Laterality: N/A;  . LUMBAR DISC SURGERY     Neck  . TUBAL LIGATION       OB History   None      Home Medications    Prior to Admission medications   Medication Sig Start Date End Date Taking? Authorizing Provider  acetaminophen (TYLENOL) 500 MG tablet Take 2 tablets (1,000 mg total) by mouth every 8 (eight) hours. 05/16/18  Yes Everitt Amber, MD  DULoxetine (CYMBALTA) 60 MG capsule Take 120 mg by mouth daily.   Yes [provider]  ibuprofen (ADVIL,MOTRIN) 400 MG tablet Take 1 tablet (400 mg total) by mouth every 6 (six) hours as needed for moderate pain. 05/16/18  Yes Denman George,  Terrence Dupont, MD  traZODone (DESYREL) 50 MG tablet Take 100 mg by mouth at bedtime.   Yes [provider]  oxyCODONE-acetaminophen (PERCOCET) 5-325 MG tablet Take 1-2 tablets by mouth every 4 (four) hours as needed for severe pain. 05/16/18   Everitt Amber, MD  pantoprazole (PROTONIX) 20 MG tablet Take 1 tablet (20 mg total) by mouth daily. Patient not taking: Reported on 05/12/2018 04/19/16   [name], MD  senna (SENOKOT) 8.6 MG TABS tablet Take 1 tablet (8.6 mg total) by mouth at bedtime.  05/16/18   Everitt Amber, MD    Family History Family History  Problem Relation Age of Onset  . Diabetes Mother   . Colon cancer Paternal Grandfather 53  . Colon cancer Paternal Uncle 74  . Heart Problems Sister        bicuspid aortic valve    Social History Social History   Tobacco Use  . Smoking status: Never Smoker  . Smokeless tobacco: Never Used  Substance Use Topics  . Alcohol use: No    Comment: occasionally  . Drug use: No     Allergies   Codeine   Review of Systems Review of Systems  Constitutional: Positive for fatigue. Negative for chills and fever.  HENT: Negative for congestion and sore throat.   Eyes: Negative for visual disturbance.  Respiratory: Negative for cough and shortness of breath.   Cardiovascular: Negative for chest pain and leg swelling.  Gastrointestinal: Negative for abdominal pain, diarrhea, nausea and vomiting.  Genitourinary: Negative for dysuria and hematuria.  Musculoskeletal: Negative for back pain and neck pain.  Skin: Negative for color change and rash.  Neurological: Positive for syncope. Negative for weakness and headaches.  Psychiatric/Behavioral: Positive for confusion.  All other systems reviewed and are negative.   Physical Exam Updated Vital Signs BP 118/76   Pulse 81   Resp 16   Ht 5' 4"  (1.626 m)   Wt 63.5 kg (140 lb)   LMP 08/29/2011   SpO2 98%   BMI 24.03 kg/m   Physical Exam  Constitutional: No distress.  Somnolent but arousable  HENT:  Head: Normocephalic and atraumatic.  Eyes: Conjunctivae are normal.  Neck: Neck supple.  Cardiovascular: Regular rhythm and intact distal pulses. Exam reveals no friction rub.  No murmur heard. Pulmonary/Chest: Effort normal and breath sounds normal. No respiratory distress.  Abdominal: Soft. She exhibits no distension. There is no tenderness.  Lower abdominal incision site c/d/i. No surrounding erythema or drainage. Does have tenderness to palpation throughout abdomen  with no rebound or guarding. Tenderness more predominant around incision site.   Musculoskeletal: She exhibits no edema.  Neurological:  Somnolent but arousable. Able to answer most questions. Moving all extremities.   Skin: Capillary refill takes 2 to 3 seconds. She is not diaphoretic. There is pallor.  Cool skin  Psychiatric: She has a normal mood and affect.  Nursing note and vitals reviewed.   ED Treatments / Results  Labs (all labs ordered are listed, but only abnormal results are displayed) Labs Reviewed  COMPREHENSIVE METABOLIC PANEL - Abnormal; Notable for the following components:      Result Value   Potassium 3.4 (*)    CO2 18 (*)    Glucose, Bld 265 (*)    Calcium 7.9 (*)    Total Protein 4.7 (*)    Albumin 2.6 (*)    AST 91 (*)    ALT 50 (*)    All other components within normal limits  CBC WITH  DIFFERENTIAL/PLATELET - Abnormal; Notable for the following components:   WBC 15.2 (*)    RBC 2.60 (*)    Hemoglobin 8.0 (*)    HCT 24.1 (*)    Neutro Abs 13.2 (*)    Abs Immature Granulocytes 0.2 (*)    All other components within normal limits  I-STAT CG4 LACTIC ACID, ED - Abnormal; Notable for the following components:   Lactic Acid, Venous 7.07 (*)    All other components within normal limits  I-STAT CHEM 8, ED - Abnormal; Notable for the following components:   Potassium 3.2 (*)    BUN 4 (*)    Glucose, Bld 279 (*)    Calcium, Ion 0.97 (*)    TCO2 19 (*)    Hemoglobin 9.9 (*)    HCT 29.0 (*)    All other components within normal limits  CULTURE, BLOOD (ROUTINE X 2)  CULTURE, BLOOD (ROUTINE X 2)  CBC WITH DIFFERENTIAL/PLATELET  URINALYSIS, ROUTINE W REFLEX MICROSCOPIC  CBC WITH DIFFERENTIAL/PLATELET  PROTIME-INR  FIBRINOGEN  I-STAT TROPONIN, ED  I-STAT CG4 LACTIC ACID, ED  I-STAT CG4 LACTIC ACID, ED  I-STAT CG4 LACTIC ACID, ED  I-STAT CG4 LACTIC ACID, ED  I-STAT CG4 LACTIC ACID, ED  TYPE AND SCREEN  PREPARE RBC (CROSSMATCH)    EKG EKG  Interpretation  Date/Time:  Tuesday May 16 2018 19:10:14 EDT Ventricular Rate:  107 PR Interval:    QRS Duration: 88 QT Interval:  365 QTC Calculation: 487 R Axis:   70 Text Interpretation:  Sinus tachycardia Atrial premature complex Minimal ST depression, anterolateral leads Borderline prolonged QT interval tachycardia new from previous Confirmed by Theotis Burrow [phone]) on 05/16/2018 11:01:44 PM   Radiology Ct Abdomen Pelvis W Contrast  Result Date: 05/16/2018 CLINICAL DATA:  Acute onset of generalized abdominal pain after recent hysterectomy and resection of ovarian neoplasm. Initial encounter. EXAM: CT ABDOMEN AND PELVIS WITH CONTRAST TECHNIQUE: Multidetector CT imaging of the abdomen and pelvis was performed using the standard protocol following bolus administration of intravenous contrast. CONTRAST:  136m OMNIPAQUE IOHEXOL 300 MG/ML  SOLN COMPARISON:  CT of the abdomen and pelvis performed 05/13/2018 FINDINGS: Lower chest: Small bilateral pleural effusions are noted, with partial consolidation of the lung bases. A trace pericardial effusion is noted. Hepatobiliary: A small non-specific hypodensity at the hepatic dome could reflect a small cyst. The gallbladder is diffusely distended. The common bile duct remains normal in caliber. Blood is noted tracking about the liver, described in further detail below. Pancreas: The pancreas is within normal limits. Spleen: The spleen is unremarkable in appearance. Adrenals/Urinary Tract: The adrenal glands are grossly unremarkable in appearance. A left renal cyst is noted. The kidneys are otherwise unremarkable. There is no evidence of hydronephrosis. No renal or ureteral stones are identified. No perinephric stranding is seen. Stomach/Bowel: The stomach is unremarkable in appearance. The small bowel is within normal limits. The appendix is not visualized; there is no evidence for appendicitis. The colon is unremarkable in appearance. Vascular/Lymphatic:  The abdominal aorta is unremarkable in appearance. The inferior vena cava is grossly unremarkable. No retroperitoneal lymphadenopathy is seen. No pelvic sidewall lymphadenopathy is identified. Reproductive: The bladder is mildly distended and contains trace air, reflecting recent Foley catheter placement. The patient is status post resection of the large ovarian mass and uterus. Other: A large amount of blood is noted throughout the abdomen and pelvis, most concentrated at the pelvis and also at the left upper quadrant. This is new from the prior study;  prior preoperative ascites was lower in attenuation. No definite active contrast extravasation is identified to determine the source of hemorrhage. Postoperative change is noted along the anterior abdominal wall. Musculoskeletal: No acute osseous abnormalities are identified. The visualized musculature is unremarkable in appearance. IMPRESSION: 1. Large amount of blood noted throughout the abdomen and pelvis, most concentrated at the pelvis and also at the left upper quadrant. This is new from the prior study. No definite active contrast extravasation is identified to determine the source of hemorrhage. 2. Small bilateral pleural effusions, with partial consolidation of the lung bases. Trace pericardial effusion noted. 3. Distended gallbladder noted. Critical Value/emergent results were called by telephone at the time of interpretation on 05/16/2018 at 11:08 pm to Dr. Corrie Dandy, who verbally acknowledged these results. Electronically Signed   By: Garald Balding M.D.   On: 05/16/2018 23:12   Dg Chest Port 1 View  Result Date: 05/16/2018 CLINICAL DATA:  Hypothermia, hypotension. EXAM: PORTABLE CHEST 1 VIEW COMPARISON:  Radiograph of May 13, 2018. FINDINGS: The heart size and mediastinal contours are within normal limits. No pneumothorax is noted. Left lung is clear. Mild right basilar atelectasis or infiltrate is noted with probable minimal right pleural  effusion. The visualized skeletal structures are unremarkable. IMPRESSION: Mild right basilar subsegmental atelectasis or infiltrate is noted with probable minimal right pleural effusion. Electronically Signed   By: Marijo Conception, M.D.   On: 05/16/2018 20:49    Procedures Procedures (including critical care time)  Medications Ordered in ED Medications  sodium chloride 0.9 % bolus 1,000 mL (has no administration in time range)  lactated ringers infusion (has no administration in time range)  fentaNYL (SUBLIMAZE) injection 50 mcg (has no administration in time range)  0.9 %  sodium chloride infusion (Manually program via Guardrails IV Fluids) (has no administration in time range)  piperacillin-tazobactam (ZOSYN) IVPB 3.375 g (0 g Intravenous Stopped 05/16/18 2127)  lactated ringers bolus 1,000 mL (1,000 mLs Intravenous New Bag/Given 05/16/18 2038)  vancomycin (VANCOCIN) IVPB 1000 mg/200 mL premix (1,000 mg Intravenous New Bag/Given 05/16/18 2132)  iohexol (OMNIPAQUE) 300 MG/ML solution 100 mL (100 mLs Intravenous Contrast Given 05/16/18 2223)     Initial Impression / Assessment and Plan / ED Course  I have reviewed the triage vital signs and the nursing notes.  Pertinent labs & imaging results that were available during my care of the patient were reviewed by me and considered in my medical decision making (see chart for details).    57 year old female with history of ulcerative colitis and anxiety status post total hysterectomy on 8/3 for hemorrhagic ovarian mass presents to the emergency department today by EMS for evaluation of unresponsiveness and hypotension.  Pt hypothermic and hypotensive at presentation. Exam as above. BP and mental status improved after IV fluids. Right tibial IO placed by EMS easily removed in ED after peripheral IVs obtained. Differential wide including post op hemorrhage, sepsis, PE, ACS. No chest pain or leg swelling. Hemorrhage and sepsis higher on differential.  Lactic >7. Blood cultures obtained. 30cc/kg IVFs given. Vanc/zosyn given emperically. Hemoglobin on Istat 9.9 down from 11.5 at discharge earlier this morning. CBC repeated and has leukocytosis and hemoglobin down to 8.0. CT abdomen/pelvis with free fluid concerning for acute blood. No obvious active hemorrhage/sourece appreciated. Consulted Gyn-Onc who will eval in ED. Concern for ongoing hemorrhage given Hb trending down. 2 units PRBCs ordered at recommendation of Gyn-onc. Repeat lactic 1.6.  Coags pending. Family updated. IV fentanyl given for abdominal pain.  HR responds to IVFs and no hypoxia or CP. PE/ACS lower on ddx. Gyn-onc present in ED and will admit.   Case and plan of care discussed with Dr. Rex Kras.   Final Clinical Impressions(s) / ED Diagnoses   Final diagnoses:  Hypotension due to blood loss  Lactic acidemia    ED Discharge Orders    None       Corrie Dandy, MD 05/17/18 0104    Rex Kras, Wenda Overland, MD 05/26/18 [phone]

## 2018-05-17 ENCOUNTER — Encounter: Payer: Self-pay | Admitting: Oncology

## 2018-05-17 ENCOUNTER — Encounter (HOSPITAL_COMMUNITY): Payer: Self-pay

## 2018-05-17 ENCOUNTER — Telehealth: Payer: Self-pay | Admitting: Oncology

## 2018-05-17 ENCOUNTER — Other Ambulatory Visit: Payer: Self-pay

## 2018-05-17 DIAGNOSIS — N99820 Postprocedural hemorrhage and hematoma of a genitourinary system organ or structure following a genitourinary system procedure: Secondary | ICD-10-CM | POA: Diagnosis present

## 2018-05-17 DIAGNOSIS — E861 Hypovolemia: Secondary | ICD-10-CM | POA: Diagnosis present

## 2018-05-17 DIAGNOSIS — M25552 Pain in left hip: Secondary | ICD-10-CM | POA: Diagnosis present

## 2018-05-17 DIAGNOSIS — I959 Hypotension, unspecified: Secondary | ICD-10-CM | POA: Diagnosis present

## 2018-05-17 DIAGNOSIS — Z79891 Long term (current) use of opiate analgesic: Secondary | ICD-10-CM | POA: Diagnosis not present

## 2018-05-17 DIAGNOSIS — I9589 Other hypotension: Secondary | ICD-10-CM | POA: Diagnosis present

## 2018-05-17 DIAGNOSIS — K661 Hemoperitoneum: Secondary | ICD-10-CM | POA: Diagnosis present

## 2018-05-17 DIAGNOSIS — R58 Hemorrhage, not elsewhere classified: Secondary | ICD-10-CM

## 2018-05-17 DIAGNOSIS — D500 Iron deficiency anemia secondary to blood loss (chronic): Secondary | ICD-10-CM | POA: Diagnosis present

## 2018-05-17 DIAGNOSIS — J9600 Acute respiratory failure, unspecified whether with hypoxia or hypercapnia: Secondary | ICD-10-CM | POA: Diagnosis present

## 2018-05-17 DIAGNOSIS — E876 Hypokalemia: Secondary | ICD-10-CM | POA: Diagnosis present

## 2018-05-17 DIAGNOSIS — K51911 Ulcerative colitis, unspecified with rectal bleeding: Secondary | ICD-10-CM | POA: Diagnosis present

## 2018-05-17 DIAGNOSIS — K5190 Ulcerative colitis, unspecified, without complications: Secondary | ICD-10-CM | POA: Diagnosis present

## 2018-05-17 DIAGNOSIS — F419 Anxiety disorder, unspecified: Secondary | ICD-10-CM | POA: Diagnosis present

## 2018-05-17 DIAGNOSIS — E872 Acidosis: Secondary | ICD-10-CM | POA: Diagnosis present

## 2018-05-17 DIAGNOSIS — R578 Other shock: Secondary | ICD-10-CM | POA: Diagnosis present

## 2018-05-17 DIAGNOSIS — Z79899 Other long term (current) drug therapy: Secondary | ICD-10-CM | POA: Diagnosis not present

## 2018-05-17 LAB — CBC WITH DIFFERENTIAL/PLATELET
ABS IMMATURE GRANULOCYTES: 0.1 10*3/uL (ref 0.0–0.1)
Basophils Absolute: 0 10*3/uL (ref 0.0–0.1)
Basophils Relative: 0 %
EOS ABS: 0 10*3/uL (ref 0.0–0.7)
Eosinophils Relative: 0 %
HEMATOCRIT: 25 % — AB (ref 36.0–46.0)
HEMOGLOBIN: 8.4 g/dL — AB (ref 12.0–15.0)
IMMATURE GRANULOCYTES: 1 %
LYMPHS ABS: 1.9 10*3/uL (ref 0.7–4.0)
LYMPHS PCT: 18 %
MCH: 29.9 pg (ref 26.0–34.0)
MCHC: 33.6 g/dL (ref 30.0–36.0)
MCV: 89 fL (ref 78.0–100.0)
MONOS PCT: 8 %
Monocytes Absolute: 0.9 10*3/uL (ref 0.1–1.0)
NEUTROS PCT: 73 %
Neutro Abs: 8.1 10*3/uL — ABNORMAL HIGH (ref 1.7–7.7)
Platelets: 143 10*3/uL — ABNORMAL LOW (ref 150–400)
RBC: 2.81 MIL/uL — ABNORMAL LOW (ref 3.87–5.11)
RDW: 14.5 % (ref 11.5–15.5)
WBC: 11 10*3/uL — ABNORMAL HIGH (ref 4.0–10.5)

## 2018-05-17 LAB — URINALYSIS, ROUTINE W REFLEX MICROSCOPIC
BACTERIA UA: NONE SEEN
BILIRUBIN URINE: NEGATIVE
Glucose, UA: NEGATIVE mg/dL
Hgb urine dipstick: NEGATIVE
Ketones, ur: NEGATIVE mg/dL
Leukocytes, UA: NEGATIVE
NITRITE: NEGATIVE
PH: 8 (ref 5.0–8.0)
PROTEIN: NEGATIVE mg/dL
Specific Gravity, Urine: 1.006 (ref 1.005–1.030)

## 2018-05-17 LAB — COMPREHENSIVE METABOLIC PANEL
ALK PHOS: 37 U/L — AB (ref 38–126)
ALT: 44 U/L (ref 0–44)
AST: 65 U/L — AB (ref 15–41)
Albumin: 2.5 g/dL — ABNORMAL LOW (ref 3.5–5.0)
Anion gap: 9 (ref 5–15)
BUN: 7 mg/dL (ref 6–20)
CO2: 24 mmol/L (ref 22–32)
CREATININE: 0.61 mg/dL (ref 0.44–1.00)
Calcium: 7.4 mg/dL — ABNORMAL LOW (ref 8.9–10.3)
Chloride: 104 mmol/L (ref 98–111)
GFR calc Af Amer: >60 mL/min (ref >60)
GLUCOSE: 91 mg/dL (ref 70–99)
Potassium: 3.4 mmol/L — ABNORMAL LOW (ref 3.5–5.1)
Sodium: 137 mmol/L (ref 135–145)
TOTAL PROTEIN: 4.4 g/dL — AB (ref 6.5–8.1)
Total Bilirubin: 1.1 mg/dL (ref 0.3–1.2)

## 2018-05-17 LAB — CBC
HCT: 27.5 % — ABNORMAL LOW (ref 36.0–46.0)
HEMATOCRIT: 26.3 % — AB (ref 36.0–46.0)
HEMOGLOBIN: 8.9 g/dL — AB (ref 12.0–15.0)
Hemoglobin: 9.4 g/dL — ABNORMAL LOW (ref 12.0–15.0)
MCH: 30.7 pg (ref 26.0–34.0)
MCH: 30.4 pg (ref 26.0–34.0)
MCHC: 33.8 g/dL (ref 30.0–36.0)
MCHC: 34.2 g/dL (ref 30.0–36.0)
MCV: 90.7 fL (ref 78.0–100.0)
MCV: 89 fL (ref 78.0–100.0)
PLATELETS: 147 10*3/uL — AB (ref 150–400)
PLATELETS: 156 10*3/uL (ref 150–400)
RBC: 2.9 MIL/uL — AB (ref 3.87–5.11)
RBC: 3.09 MIL/uL — ABNORMAL LOW (ref 3.87–5.11)
RDW: 14.2 % (ref 11.5–15.5)
RDW: 15.2 % (ref 11.5–15.5)
WBC: 12.8 10*3/uL — ABNORMAL HIGH (ref 4.0–10.5)
WBC: 10.6 10*3/uL — AB (ref 4.0–10.5)

## 2018-05-17 LAB — BODY FLUID CULTURE
Culture: NO GROWTH
Gram Stain: NONE SEEN

## 2018-05-17 LAB — PROTIME-INR
INR: 1.25
PROTHROMBIN TIME: 15.6 s — AB (ref 11.4–15.2)

## 2018-05-17 LAB — I-STAT CG4 LACTIC ACID, ED: Lactic Acid, Venous: 1.01 mmol/L (ref 0.5–1.9)

## 2018-05-17 LAB — FIBRINOGEN: Fibrinogen: 360 mg/dL (ref 210–475)

## 2018-05-17 LAB — PREPARE RBC (CROSSMATCH)

## 2018-05-17 MED ORDER — KCL IN DEXTROSE-NACL 20-5-0.45 MEQ/L-%-% IV SOLN
INTRAVENOUS | Status: DC
  Administered 2018-05-17: 125 mL/h via INTRAVENOUS
  Filled 2018-05-17: qty 1000

## 2018-05-17 MED ORDER — SENNA 8.6 MG PO TABS
1.0000 | ORAL_TABLET | Freq: Every day | ORAL | Status: DC
  Administered 2018-05-17 – 2018-05-18 (×2): 8.6 mg via ORAL
  Filled 2018-05-17 (×3): qty 1

## 2018-05-17 MED ORDER — ONDANSETRON HCL 4 MG PO TABS: 4.0000 mg | ORAL_TABLET | Freq: Four times a day (QID) | ORAL | Status: DC | PRN

## 2018-05-17 MED ORDER — TRAZODONE HCL 100 MG PO TABS
100.0000 mg | ORAL_TABLET | Freq: Every day | ORAL | Status: DC
  Administered 2018-05-19: 100 mg via ORAL

## 2018-05-17 MED ORDER — POTASSIUM CHLORIDE 2 MEQ/ML IV SOLN: INTRAVENOUS | Status: DC

## 2018-05-17 MED ORDER — ONDANSETRON HCL 4 MG/2ML IJ SOLN: 4.0000 mg | Freq: Four times a day (QID) | INTRAMUSCULAR | Status: DC | PRN

## 2018-05-17 MED ORDER — PANTOPRAZOLE SODIUM 20 MG PO TBEC
20.0000 mg | DELAYED_RELEASE_TABLET | Freq: Every day | ORAL | Status: DC
  Administered 2018-05-17 – 2018-05-20 (×4): 20 mg via ORAL
  Filled 2018-05-17 (×4): qty 1

## 2018-05-17 MED ORDER — TRAZODONE HCL 100 MG PO TABS
100.0000 mg | ORAL_TABLET | Freq: Every day | ORAL | Status: DC
  Administered 2018-05-17 – 2018-05-18 (×2): 100 mg via ORAL
  Filled 2018-05-17 (×3): qty 1

## 2018-05-17 MED ORDER — SIMETHICONE 80 MG PO CHEW: 80.0000 mg | CHEWABLE_TABLET | Freq: Four times a day (QID) | ORAL | Status: DC | PRN

## 2018-05-17 MED ORDER — DULOXETINE HCL 60 MG PO CPEP
120.0000 mg | ORAL_CAPSULE | Freq: Every day | ORAL | Status: DC
  Administered 2018-05-17 – 2018-05-20 (×4): 120 mg via ORAL
  Filled 2018-05-17 (×4): qty 2

## 2018-05-17 MED ORDER — TRAMADOL HCL 50 MG PO TABS
100.0000 mg | ORAL_TABLET | Freq: Four times a day (QID) | ORAL | Status: DC | PRN
  Administered 2018-05-17 (×3): 100 mg via ORAL
  Filled 2018-05-17 (×3): qty 2

## 2018-05-17 NOTE — Telephone Encounter (Signed)
Called [facility] regarding ordering cytology on washings from surgery.

## 2018-05-17 NOTE — Progress Notes (Signed)
   05/17/18 0100  Clinical Encounter Type  Visited With Patient;Patient and family together  Visit Type Initial  Referral From Nurse  Consult/Referral To Chaplain  Spiritual Encounters  Spiritual Needs Emotional  Stress Factors  Patient Stress Factors Exhausted  Family Stress Factors Exhausted   This was a page for a level two trauma C . Pt was a 67 yr caucasian female who came in withacute hip pain. Pt's daughter and step father were on-site. Chaplain had a conversation with family and provided drinks and compassionate presence.  Audrea Bolte a Medical sales representative, [employer]

## 2018-05-17 NOTE — H&P (Addendum)
Jacqueline Castro is an 57 y.o. female.   Chief Complaint: Syncopal episode  HPI:   The patient was BIBEMS after a syncopal episode 3 days s/p a TAH-BSO, omentectomy for torsion/rupture of a large left-sided adnexal mass/tumor. She was discharged earlier today.   Upon arrival to the ED she was hypotensive.  Initial labs were remarkable for an elevated lactic acid; the lactic acid level is downward trending.  A CT scan revealed a large hemoperitoneum with blood tracking around the liver.  No definitive point of extravasation.  A repeat hgb was 8.  The patient also endorsed feeling hot and cold; no fever.  She has had vaginal spotting.    Past Medical History:  Diagnosis Date  . Anemia   . Anxiety   . Chest pain   . Palpitations   . Proctitis   . Ulcerative colitis     Past Surgical History:  Procedure Laterality Date  . CESAREAN SECTION    . LAPAROTOMY N/A 05/13/2018   Procedure: EXPLORATORY LAPAROTOMY, TOTAL ABDOMINAL HYSTERECTOMY, BILATERAL SALPINGO-OOPHORECTOMY, OMENTECTOMY;  Surgeon: Everitt Amber, MD;  Location: [facility];  Service: Gynecology;  Laterality: N/A;  . LUMBAR DISC SURGERY     Neck  . TUBAL LIGATION      Family History  Problem Relation Age of Onset  . Diabetes Mother   . Colon cancer Paternal Grandfather 42  . Colon cancer Paternal Uncle 22  . Heart Problems Sister        bicuspid aortic valve   Social History:  reports that she has never smoked. She has never used smokeless tobacco. She reports that she does not drink alcohol or use drugs.  Allergies:  Allergies  Allergen Reactions  . Codeine     REACTION: vomiting     (Not in a hospital admission)  Results for orders placed or performed during the hospital encounter of 05/16/18 (from the past 48 hour(s))  Comprehensive metabolic panel     Status: Abnormal   Collection Time: 05/16/18  7:25 PM  Result Value Ref Range   Sodium 138 135 - 145 mmol/L   Potassium 3.4 (L) 3.5 - 5.1 mmol/L   Chloride 105 98 - 111  mmol/L   CO2 18 (L) 22 - 32 mmol/L   Glucose, Bld 265 (H) 70 - 99 mg/dL   BUN 6 6 - 20 mg/dL   Creatinine, Ser 0.94 0.44 - 1.00 mg/dL   Calcium 7.9 (L) 8.9 - 10.3 mg/dL   Total Protein 4.7 (L) 6.5 - 8.1 g/dL   Albumin 2.6 (L) 3.5 - 5.0 g/dL   AST 91 (H) 15 - 41 U/L   ALT 50 (H) 0 - 44 U/L   Alkaline Phosphatase 49 38 - 126 U/L   Total Bilirubin 0.7 0.3 - 1.2 mg/dL   GFR calc non Af Amer >60 >60 mL/min   GFR calc Af Amer >60 >60 mL/min    Comment: (NOTE) The eGFR has been calculated using the CKD EPI equation. This calculation has not been validated in all clinical situations. eGFR's persistently <60 mL/min signify possible Chronic Kidney Disease.    Anion gap 15 5 - 15    Comment: Performed at [facility] [address] [place], [place] [address]  I-stat troponin, ED (not at [facility], [facility])     Status: None   Collection Time: 05/16/18  7:40 PM  Result Value Ref Range   Troponin i, poc 0.05 0.00 - 0.08 ng/mL   Comment 3  Comment: Due to the release kinetics of cTnI, a negative result within the first hours of the onset of symptoms does not rule out myocardial infarction with certainty. If myocardial infarction is still suspected, repeat the test at appropriate intervals.   I-stat Chem 8, ED     Status: Abnormal   Collection Time: 05/16/18  7:41 PM  Result Value Ref Range   Sodium 138 135 - 145 mmol/L   Potassium 3.2 (L) 3.5 - 5.1 mmol/L   Chloride 105 98 - 111 mmol/L   BUN 4 (L) 6 - 20 mg/dL   Creatinine, Ser 0.60 0.44 - 1.00 mg/dL   Glucose, Bld 279 (H) 70 - 99 mg/dL   Calcium, Ion 0.97 (L) 1.15 - 1.40 mmol/L   TCO2 19 (L) 22 - 32 mmol/L   Hemoglobin 9.9 (L) 12.0 - 15.0 g/dL   HCT 29.0 (L) 36.0 - 46.0 %  I-Stat CG4 Lactic Acid, ED  (not at  [facility])     Status: Abnormal   Collection Time: 05/16/18  7:42 PM  Result Value Ref Range   Lactic Acid, Venous 7.07 (HH) 0.5 - 1.9 mmol/L   Comment NOTIFIED PHYSICIAN   Type and screen Gerty      Status: None (Preliminary result)   Collection Time: 05/16/18 10:53 PM  Result Value Ref Range   ABO/RH(D) B NEG    Antibody Screen NEG    Sample Expiration      05/19/2018 Performed at [facility], [address] [place], [place] [address]    Unit Number [id number]    Blood Component Type RED CELLS,LR    Unit division 00    Status of Unit ALLOCATED    Transfusion Status OK TO TRANSFUSE    Crossmatch Result Compatible    Unit Number [id number]    Blood Component Type RBC LR PHER2    Unit division 00    Status of Unit ALLOCATED    Transfusion Status OK TO TRANSFUSE    Crossmatch Result Compatible   I-Stat CG4 Lactic Acid, ED  (not at  [facility])     Status: None   Collection Time: 05/16/18 11:05 PM  Result Value Ref Range   Lactic Acid, Venous 1.60 0.5 - 1.9 mmol/L  CBC with Differential     Status: Abnormal   Collection Time: 05/16/18 11:18 PM  Result Value Ref Range   WBC 15.2 (H) 4.0 - 10.5 K/uL   RBC 2.60 (L) 3.87 - 5.11 MIL/uL   Hemoglobin 8.0 (L) 12.0 - 15.0 g/dL   HCT 24.1 (L) 36.0 - 46.0 %   MCV 92.7 78.0 - 100.0 fL   MCH 30.8 26.0 - 34.0 pg   MCHC 33.2 30.0 - 36.0 g/dL   RDW 13.7 11.5 - 15.5 %   Platelets 174 150 - 400 K/uL   Neutrophils Relative % 87 %   Neutro Abs 13.2 (H) 1.7 - 7.7 K/uL   Lymphocytes Relative 7 %   Lymphs Abs 1.1 0.7 - 4.0 K/uL   Monocytes Relative 5 %   Monocytes Absolute 0.7 0.1 - 1.0 K/uL   Eosinophils Relative 0 %   Eosinophils Absolute 0.0 0.0 - 0.7 K/uL   Basophils Relative 0 %   Basophils Absolute 0.0 0.0 - 0.1 K/uL   Immature Granulocytes 1 %   Abs Immature Granulocytes 0.2 (H) 0.0 - 0.1 K/uL    Comment: Performed at [facility], [address], [place], [place] [address]  Prepare  RBC     Status: None   Collection Time: 05/17/18 12:07 AM  Result Value Ref Range   Order Confirmation      ORDER PROCESSED BY BLOOD BANK Performed at [facility], [place] [address], [place], [place] [address]    Ct Abdomen  Pelvis W Contrast  Result Date: 05/16/2018 CLINICAL DATA:  Acute onset of generalized abdominal pain after recent hysterectomy and resection of ovarian neoplasm. Initial encounter. EXAM: CT ABDOMEN AND PELVIS WITH CONTRAST TECHNIQUE: Multidetector CT imaging of the abdomen and pelvis was performed using the standard protocol following bolus administration of intravenous contrast. CONTRAST:  161m OMNIPAQUE IOHEXOL 300 MG/ML  SOLN COMPARISON:  CT of the abdomen and pelvis performed 05/13/2018 FINDINGS: Lower chest: Small bilateral pleural effusions are noted, with partial consolidation of the lung bases. A trace pericardial effusion is noted. Hepatobiliary: A small non-specific hypodensity at the hepatic dome could reflect a small cyst. The gallbladder is diffusely distended. The common bile duct remains normal in caliber. Blood is noted tracking about the liver, described in further detail below. Pancreas: The pancreas is within normal limits. Spleen: The spleen is unremarkable in appearance. Adrenals/Urinary Tract: The adrenal glands are grossly unremarkable in appearance. A left renal cyst is noted. The kidneys are otherwise unremarkable. There is no evidence of hydronephrosis. No renal or ureteral stones are identified. No perinephric stranding is seen. Stomach/Bowel: The stomach is unremarkable in appearance. The small bowel is within normal limits. The appendix is not visualized; there is no evidence for appendicitis. The colon is unremarkable in appearance. Vascular/Lymphatic: The abdominal aorta is unremarkable in appearance. The inferior vena cava is grossly unremarkable. No retroperitoneal lymphadenopathy is seen. No pelvic sidewall lymphadenopathy is identified. Reproductive: The bladder is mildly distended and contains trace air, reflecting recent Foley catheter placement. The patient is status post resection of the large ovarian mass and uterus. Other: A large amount of blood is noted throughout the  abdomen and pelvis, most concentrated at the pelvis and also at the left upper quadrant. This is new from the prior study; prior preoperative ascites was lower in attenuation. No definite active contrast extravasation is identified to determine the source of hemorrhage. Postoperative change is noted along the anterior abdominal wall. Musculoskeletal: No acute osseous abnormalities are identified. The visualized musculature is unremarkable in appearance. IMPRESSION: 1. Large amount of blood noted throughout the abdomen and pelvis, most concentrated at the pelvis and also at the left upper quadrant. This is new from the prior study. No definite active contrast extravasation is identified to determine the source of hemorrhage. 2. Small bilateral pleural effusions, with partial consolidation of the lung bases. Trace pericardial effusion noted. 3. Distended gallbladder noted. Critical Value/emergent results were called by telephone at the time of interpretation on 05/16/2018 at 11:08 pm to Dr. BCorrie Dandy who verbally acknowledged these results. Electronically Signed   By: JGarald BaldingM.D.   On: 05/16/2018 23:12   Dg Chest Port 1 View  Result Date: 05/16/2018 CLINICAL DATA:  Hypothermia, hypotension. EXAM: PORTABLE CHEST 1 VIEW COMPARISON:  Radiograph of May 13, 2018. FINDINGS: The heart size and mediastinal contours are within normal limits. No pneumothorax is noted. Left lung is clear. Mild right basilar atelectasis or infiltrate is noted with probable minimal right pleural effusion. The visualized skeletal structures are unremarkable. IMPRESSION: Mild right basilar subsegmental atelectasis or infiltrate is noted with probable minimal right pleural effusion. Electronically Signed   By: JMarijo Conception M.D.   On:  05/16/2018 20:49    Review of Systems  Constitutional: Positive for chills.  Respiratory: Negative for shortness of breath.   Cardiovascular: Negative for chest pain and palpitations.   Gastrointestinal: Negative for abdominal pain, nausea and vomiting.  Genitourinary:       Vaginal spotting    Blood pressure 118/76, pulse 81, resp. rate 16, height 5' 4"  (1.626 m), weight 140 lb (63.5 kg), last menstrual period 08/29/2011, SpO2 98 %. Physical Exam  Constitutional: No distress.  HENT:  Head: Normocephalic and atraumatic.  Cardiovascular: Normal rate.  GI: Soft. She exhibits no distension.  Incision intact. Small ecchymotic areas.     Assessment/Plan Delayed postoperative bleed; hypovolemia now s/p intravascular volume/fluid resuscitation.  The CT findings were reviewed w/IR; there are no vessels amenable to embolization.  Cor:  No C/P.  MAP> 65.  Troponin in range.  Heme: Anemia 2/2 blood loss.    FEN: Lactic acid downward trending.  Mild hypokalemia.  GI: Mild LFT elevations--?contraction alkalosis  GU: Creatinine in range.  No obvious AKI  ID: Mild leukocytosis, afebrile.  SIRS/sepsis picture less likely  >Admit to stepdown unit/monitored bed >Monitor vital signs, u.o closely >Serial labs >transfuse to maintain hgb at > 7.5 g/dL  Lahoma Crocker, MD 05/17/2018, 1:00 AM

## 2018-05-17 NOTE — ED Notes (Signed)
Per RN collect CBC per doctor

## 2018-05-17 NOTE — Progress Notes (Addendum)
Gynecologic Oncology  Patient is currently admitted for after having a syncopal episode 3 days s/p a TAH-BSO, omentectomy for torsion/rupture of a large left-sided adnexal mass/tumor. She was discharged home on 05/16/18.   Upon arrival to the ED she was hypotensive.  Initial labs were remarkable for an elevated lactic acid which is now downward trending.  A CT scan revealed a large hemoperitoneum with blood tracking around the liver with no definitive point of extravasation.  A repeat hgb was 8.  Subjective: Patient reports strong feelings of thirst this am with some relief after eating an [place] ice.  Feelings of hot/cold improved since last pm.  Stating she had a rough night in the ED and was unable to rest since she was laying on a hard surface.  Minimal abdominal discomfort reported this am. States she feels she has phlegm in her chest that is unable to cough up. Has not been out of bed.  No nausea or emesis reported.  Ordered solid food from breakfast.  Passing flatus, no BM reported.  Denies chest pain, dyspnea, lightheadedness.   Objective: Vital signs in last 24 hours: Temp:  [98.2 F (36.8 C)-98.4 F (36.9 C)] 98.2 F (36.8 C) (08/07 0746) Pulse Rate:  [72-101] 74 (08/07 0746) Resp:  [16-22] 18 (08/07 0746) BP: (78-137)/(55-95) 137/85 (08/07 0746) SpO2:  [96 %-100 %] 96 % (08/07 0746) Weight:  [140 lb (63.5 kg)] 140 lb (63.5 kg) (08/06 1909) Last BM Date: (pt states prior to surgery 8/3)  Intake/Output from previous day: 08/06 0701 - 08/07 0700 In: 2923.4 [I.V.:1408.4; Blood:315; IV Piggyback:1200] Out: -   Physical Examination: General: alert, cooperative and no distress Resp: clear to auscultation bilaterally Cardio: intermittent extra beats, no murmurs/clicks/rubs GI: soft, non-tender; bowel sounds normal; no masses,  no organomegaly and incision: Midline incision with op site dressing in place with ecchymosis present around the incision with no active bleeding or drainage  noted Extremities: extremities normal, atraumatic, no cyanosis or edema  Foley with clear, yellow urine  Labs: WBC/Hgb/Hct/Plts:  11.0/8.4/25.0/143 (08/07 3007) BUN/Cr/glu/ALT/AST/amyl/lip:  7/0.61/--/44/65/--/-- (08/07 6226)  Assessment: 57 y.o. s/p : stable Pain:  Pain is well-controlled on PRN medications.  Heme: Hgb 8.4 and Hct 25.0 this am.  S/P transfusion of 2 units PRBC. Plan for repeat at noon.  ID: WBC 11.0 this am.  Zosyn and Vanc IV given in the ED empirically. Blood cultures drawn.    CV: BP and HR stable at this time. Hypotension improved.    GI:  Tolerating po: Yes.     GU: Foley in place with clear, yellow urine.  Urine output not available for review.    FEN: K+3.4 this am.  IVF with K+.  Prophylaxis: SCDs in place.  Plan: Plan for repeat CBC at noon to assess stability Discontinue foley Regular diet as tolerated Increase mobility with assist Continue plan of care per Dr. Denman George   LOS: 0 days    Dorothyann Gibbs 05/17/2018, 9:31 AM     Patient seen by me this evening. She looks good, feels better. Has bloating in the abdo and no BM but is passing flatus. Has some left hip pain with hip flexion - possible psoas hematoma. Hb appropriate (9.4) after transfusion. Mae 1600cc urine today and normalized vital signs suggesting adequate resuscitation and no ongoing bleeding. Have d.c'd foley and hep locked IVF. Will recheck labs in morning. 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  Thereasa [facility], MD

## 2018-05-18 ENCOUNTER — Encounter: Payer: Self-pay | Admitting: Gynecologic Oncology

## 2018-05-18 DIAGNOSIS — D279 Benign neoplasm of unspecified ovary: Secondary | ICD-10-CM | POA: Insufficient documentation

## 2018-05-18 LAB — BASIC METABOLIC PANEL
ANION GAP: 8 (ref 5–15)
BUN: 6 mg/dL (ref 6–20)
CALCIUM: 8.3 mg/dL — AB (ref 8.9–10.3)
CO2: 27 mmol/L (ref 22–32)
Chloride: 106 mmol/L (ref 98–111)
Creatinine, Ser: 0.67 mg/dL (ref 0.44–1.00)
GFR calc non Af Amer: >60 mL/min (ref >60)
GLUCOSE: 96 mg/dL (ref 70–99)
POTASSIUM: 3.8 mmol/L (ref 3.5–5.1)
Sodium: 141 mmol/L (ref 135–145)

## 2018-05-18 LAB — CBC WITH DIFFERENTIAL/PLATELET
Abs Immature Granulocytes: 0.1 10*3/uL (ref 0.0–0.1)
Basophils Absolute: 0 10*3/uL (ref 0.0–0.1)
Basophils Relative: 0 %
EOS ABS: 0.2 10*3/uL (ref 0.0–0.7)
EOS PCT: 3 %
HEMATOCRIT: 25.3 % — AB (ref 36.0–46.0)
Hemoglobin: 8.4 g/dL — ABNORMAL LOW (ref 12.0–15.0)
Immature Granulocytes: 1 %
LYMPHS ABS: 2.6 10*3/uL (ref 0.7–4.0)
Lymphocytes Relative: 33 %
MCH: 29.8 pg (ref 26.0–34.0)
MCHC: 33.2 g/dL (ref 30.0–36.0)
MCV: 89.7 fL (ref 78.0–100.0)
Monocytes Absolute: 0.6 10*3/uL (ref 0.1–1.0)
Monocytes Relative: 8 %
Neutro Abs: 4.3 10*3/uL (ref 1.7–7.7)
Neutrophils Relative %: 55 %
Platelets: 148 10*3/uL — ABNORMAL LOW (ref 150–400)
RBC: 2.82 MIL/uL — AB (ref 3.87–5.11)
RDW: 15.4 % (ref 11.5–15.5)
WBC: 7.8 10*3/uL (ref 4.0–10.5)

## 2018-05-18 LAB — HEMOGLOBIN AND HEMATOCRIT, BLOOD
HEMATOCRIT: 28.1 % — AB (ref 36.0–46.0)
Hemoglobin: 9.2 g/dL — ABNORMAL LOW (ref 12.0–15.0)

## 2018-05-18 MED ORDER — FERROUS GLUCONATE 324 (38 FE) MG PO TABS
324.0000 mg | ORAL_TABLET | Freq: Three times a day (TID) | ORAL | Status: DC
  Administered 2018-05-18 – 2018-05-20 (×6): 324 mg via ORAL
  Filled 2018-05-18 (×7): qty 1

## 2018-05-18 NOTE — Progress Notes (Signed)
Gynecologic Oncology  Patient is currently admitted for after having a syncopal episode 3 days s/p a TAH-BSO, omentectomy for torsion/rupture of a large left-sided adnexal benign tumor. She was discharged home on 05/16/18.   Upon arrival to the ED she was hypotensive.  Initial labs were remarkable for an elevated lactic acid which is now downward trending.  A CT scan revealed a large hemoperitoneum with blood tracking around the liver with no definitive point of extravasation.  A repeat hgb was 8. She received 2 units PRBC and Hb increased to 9.4  Subjective: Patient reports feeling better this morning. Slept well. + flatus, no bm's. No nausea No pain.  Objective: Vital signs in last 24 hours: Temp:  [97.9 F (36.6 C)-98.9 F (37.2 C)] 98.6 F (37 C) (08/08 0740) Pulse Rate:  [75-92] 84 (08/08 0740) Resp:  [17-20] 18 (08/08 0740) BP: (117-140)/(62-97) 140/82 (08/08 0740) SpO2:  [92 %-100 %] 97 % (08/08 0740) Last BM Date: 05/13/18(Reports before surgery)  Intake/Output from previous day: 08/07 0701 - 08/08 0700 In: 1843.6 [P.O.:720; I.V.:1123.6] Out: 1300 [Urine:1300]  Physical Examination: General: alert, cooperative and no distress Resp: clear to auscultation bilaterally Cardio: intermittent extra beats, no murmurs/clicks/rubs GI: soft, non-tender; bowel sounds normal; no masses,  no organomegaly and incision: Midline incision with op site dressing in place with ecchymosis present around the incision with no active bleeding or drainage noted Extremities: extremities normal, atraumatic, no cyanosis or edema  Foley with clear, yellow urine  Labs: WBC/Hgb/Hct/Plts:  7.8/8.4/25.3/148 (08/08 0417) BUN/Cr/glu/ALT/AST/amyl/lip:  6/0.67/--/--/--/--/-- (08/08 4098)  Assessment: 57 y.o. s/p : stable Pain:  Pain is well-controlled on PRN medications.  Heme: Hgb 8.4  this am.  S/P transfusion of 2 units PRBC on 05/17/18. Plan for repeat at noon. Ferrous gluconate for anemia.   ID: WBC  normal, afebrile. No infectious pathology.  CV: BP and HR stable at this time. Hypotension improved.    GI:  Tolerating po: Yes.     GU: Making adequate UO. Creatinine normal and stable.   FEN: stable. IVF's hep locked.   Prophylaxis: SCDs in place.  Plan: Plan for repeat CBC at noon to assess stability Ferrous gluconate for anemia. sennakot for constipation. Encourage ambulation. Continue to monitor for signs of active bleeding. No indication for procedural intervention at this time.   Thereasa [facility], MD

## 2018-05-19 LAB — CBC WITH DIFFERENTIAL/PLATELET
ABS IMMATURE GRANULOCYTES: 0.1 10*3/uL (ref 0.0–0.1)
Basophils Absolute: 0 10*3/uL (ref 0.0–0.1)
Basophils Relative: 1 %
EOS ABS: 0.3 10*3/uL (ref 0.0–0.7)
Eosinophils Relative: 4 %
HEMATOCRIT: 26.2 % — AB (ref 36.0–46.0)
Hemoglobin: 8.5 g/dL — ABNORMAL LOW (ref 12.0–15.0)
IMMATURE GRANULOCYTES: 1 %
LYMPHS ABS: 2.1 10*3/uL (ref 0.7–4.0)
LYMPHS PCT: 27 %
MCH: 30.1 pg (ref 26.0–34.0)
MCHC: 32.4 g/dL (ref 30.0–36.0)
MCV: 92.9 fL (ref 78.0–100.0)
MONOS PCT: 9 %
Monocytes Absolute: 0.7 10*3/uL (ref 0.1–1.0)
NEUTROS ABS: 4.4 10*3/uL (ref 1.7–7.7)
NEUTROS PCT: 58 %
Platelets: 192 10*3/uL (ref 150–400)
RBC: 2.82 MIL/uL — ABNORMAL LOW (ref 3.87–5.11)
RDW: 15.2 % (ref 11.5–15.5)
WBC: 7.6 10*3/uL (ref 4.0–10.5)

## 2018-05-19 LAB — OCCULT BLOOD X 1 CARD TO LAB, STOOL: Fecal Occult Bld: POSITIVE — AB

## 2018-05-19 LAB — BASIC METABOLIC PANEL
ANION GAP: 9 (ref 5–15)
BUN: 7 mg/dL (ref 6–20)
CO2: 28 mmol/L (ref 22–32)
Calcium: 8.6 mg/dL — ABNORMAL LOW (ref 8.9–10.3)
Chloride: 105 mmol/L (ref 98–111)
Creatinine, Ser: 0.74 mg/dL (ref 0.44–1.00)
GFR calc Af Amer: >60 mL/min (ref >60)
GFR calc non Af Amer: >60 mL/min (ref >60)
GLUCOSE: 97 mg/dL (ref 70–99)
POTASSIUM: 4.6 mmol/L (ref 3.5–5.1)
Sodium: 142 mmol/L (ref 135–145)

## 2018-05-19 LAB — ANAEROBIC CULTURE

## 2018-05-19 NOTE — Progress Notes (Addendum)
Gynecologic Oncology  Patient is currently admitted for after having a syncopal episode 3 days s/p a TAH-BSO, omentectomy for torsion/rupture of a large left-sided adnexal mass/tumor. She was discharged home on 05/16/18.   Upon arrival to the ED she was hypotensive.  Initial labs were remarkable for an elevated lactic acid which is now downward trending.  A CT scan revealed a large hemoperitoneum with blood tracking around the liver with no definitive point of extravasation.    Subjective: Patient reports waking up in the night with her bed soaked from sweating.  States she is still having strong feelings of being hot then cold.  Tolerating diet with no nausea or emesis.  Passing flatus.  States she was attempting to have a BM last pm and noted blood when she wiped which concerns her.  She feels that the bleeding came from her rectum. States she is going to wear a peri-pad today to see if there is any further bleeding.  States she has a history of ulcerative colitis but has not had any issues for a long time.  Reporting phelgm in her chest but she is unable to cough anything up. Denies chest pain, dyspnea, lightheadedness. No other concerns voiced.  Objective: Vital signs in last 24 hours: Temp:  [98.5 F (36.9 C)-99.3 F (37.4 C)] 99.3 F (37.4 C) (08/09 0731) Pulse Rate:  [83-90] 90 (08/09 0731) Resp:  [18] 18 (08/09 0731) BP: (117-139)/(72-83) 139/83 (08/09 0731) SpO2:  [95 %-99 %] 96 % (08/09 0731) Last BM Date: 05/11/18  Intake/Output from previous day: 08/08 0701 - 08/09 0700 In: 360 [P.O.:360] Out: 800 [Urine:800]  Physical Examination: General: alert, cooperative and no distress Resp: clear to auscultation bilaterally Cardio: intermittent extra beats-improved from 05/18/18 assessment, no murmurs/clicks/rubs GI: soft, non-tender; bowel sounds normal; no masses,  no organomegaly and incision: Midline incision with op site dressing in place with ecchymosis present around the incision  with no active bleeding or drainage noted Extremities: extremities normal, atraumatic, no cyanosis or edema   Labs: WBC/Hgb/Hct/Plts:  7.6/8.5/26.2/192 (08/09 0416) BUN/Cr/glu/ALT/AST/amyl/lip:  7/0.74/--/--/--/--/-- (08/09 0416)  BMET    Component Value Date/Time   NA 142 05/19/2018 0416   K 4.6 05/19/2018 0416   CL 105 05/19/2018 0416   CO2 28 05/19/2018 0416   GLUCOSE 97 05/19/2018 0416   BUN 7 05/19/2018 0416   CREATININE 0.74 05/19/2018 0416   CALCIUM 8.6 (L) 05/19/2018 0416   GFRNONAA >60 05/19/2018 0416   GFRAA >60 05/19/2018 0416   CBC    Component Value Date/Time   WBC 7.6 05/19/2018 0416   RBC 2.82 (L) 05/19/2018 0416   HGB 8.5 (L) 05/19/2018 0416   HCT 26.2 (L) 05/19/2018 0416   PLT 192 05/19/2018 0416   MCV 92.9 05/19/2018 0416   MCH 30.1 05/19/2018 0416   MCHC 32.4 05/19/2018 0416   RDW 15.2 05/19/2018 0416   LYMPHSABS 2.1 05/19/2018 0416   MONOABS 0.7 05/19/2018 0416   EOSABS 0.3 05/19/2018 0416   BASOSABS 0.0 05/19/2018 0416   Assessment: 57 y.o. s/p : stable Pain:  Pain is well-controlled on PRN medications.  Heme: Hgb 8.5 and Hct 26.2 this am.  S/P transfusion of 2 units PRBC upon admission. Plan for repeat in the am.  ID: WBC 7.6 this am.  Zosyn and Vanc IV given in the ED empirically. Blood cultures with no growth at this time.  No obvious sign of infection.    CV: BP and HR stable at this time. Hypotension improved.  GI:  Tolerating po: Yes.     GU: Voiding adequate amounts. Creatinine 0.74 this am.  FEN: Appropriate.  K+ 4.6 this am.  Prophylaxis: SCDs in place.  Plan: Plan for repeat CBC in the am Pt to monitor for bleeding with peri-pad use today  Continue plan of care per Dr. Gerarda Fraction   LOS: 2 days    Jacqueline Castro 05/19/2018, 10:52 AM  Patient thinks she passed BRBPR this morning (~2cc). Hemoccult will be ordered. Keep patient admitted for repeat H/H tomorrow, given fluctuations and now ? Of BRBPR

## 2018-05-20 LAB — BPAM RBC
BLOOD PRODUCT EXPIRATION DATE: 201909032359
Blood Product Expiration Date: 201908102359
ISSUE DATE / TIME: 201908060928
ISSUE DATE / TIME: 201908070138
UNIT TYPE AND RH: 1700
Unit Type and Rh: 1700

## 2018-05-20 LAB — TYPE AND SCREEN
ABO/RH(D): B NEG
Antibody Screen: NEGATIVE
UNIT DIVISION: 0
Unit division: 0

## 2018-05-20 LAB — CBC WITH DIFFERENTIAL/PLATELET
Abs Immature Granulocytes: 0.1 10*3/uL (ref 0.0–0.1)
BASOS ABS: 0 10*3/uL (ref 0.0–0.1)
Basophils Relative: 0 %
EOS PCT: 3 %
Eosinophils Absolute: 0.3 10*3/uL (ref 0.0–0.7)
HEMATOCRIT: 26.5 % — AB (ref 36.0–46.0)
HEMOGLOBIN: 8.6 g/dL — AB (ref 12.0–15.0)
Immature Granulocytes: 1 %
LYMPHS PCT: 18 %
Lymphs Abs: 1.4 10*3/uL (ref 0.7–4.0)
MCH: 30.4 pg (ref 26.0–34.0)
MCHC: 32.5 g/dL (ref 30.0–36.0)
MCV: 93.6 fL (ref 78.0–100.0)
MONO ABS: 0.7 10*3/uL (ref 0.1–1.0)
MONOS PCT: 9 %
Neutro Abs: 5.4 10*3/uL (ref 1.7–7.7)
Neutrophils Relative %: 69 %
Platelets: 207 10*3/uL (ref 150–400)
RBC: 2.83 MIL/uL — ABNORMAL LOW (ref 3.87–5.11)
RDW: 15.5 % (ref 11.5–15.5)
WBC: 7.9 10*3/uL (ref 4.0–10.5)

## 2018-05-20 LAB — BASIC METABOLIC PANEL
Anion gap: 9 (ref 5–15)
BUN: 8 mg/dL (ref 6–20)
CHLORIDE: 105 mmol/L (ref 98–111)
CO2: 26 mmol/L (ref 22–32)
Calcium: 8.4 mg/dL — ABNORMAL LOW (ref 8.9–10.3)
Creatinine, Ser: 0.71 mg/dL (ref 0.44–1.00)
GFR calc Af Amer: >60 mL/min (ref >60)
GFR calc non Af Amer: >60 mL/min (ref >60)
GLUCOSE: 104 mg/dL — AB (ref 70–99)
POTASSIUM: 3.8 mmol/L (ref 3.5–5.1)
SODIUM: 140 mmol/L (ref 135–145)

## 2018-05-20 MED ORDER — FERROUS GLUCONATE 324 (38 FE) MG PO TABS: 324.0000 mg | ORAL_TABLET | Freq: Two times a day (BID) | ORAL | 3 refills | Status: DC

## 2018-05-20 NOTE — Discharge Summary (Signed)
Physician Discharge Summary  Patient ID: Jacqueline Castro MRN: [id number] DOB/AGE: 03-24-1961 57 y.o.  Admit date: 05/16/2018 Discharge date: 05/20/2018  Admission Diagnoses: syncope/anemia postop  Discharge Diagnoses:  Now resolved Active Problems:   Shock ([place])   Hypovolemia due to hemorrhage BRBPR  Discharged Condition: stable  Hospital Course:  1/ patient was admitted on 05/16/18 after being discharged the same morning for a syncopal episode with workup revealing anemia 2/ Transfusion was ordered. Hgb stabilized 3/ Had episode of BRBPR with + hemoccult. GI was consulted 4/ Patient had met postoperative criteria for discharge  5/ new medications on discharge include iron.   Consults: GI  Significant Diagnostic Studies: None  Treatments: Transfusion  Discharge Exam: Blood pressure (!) 142/77, pulse 94, temperature 98.8 F (37.1 C), temperature source Oral, resp. rate 20, height 5' 4"  (1.626 m), weight 140 lb (63.5 kg), last menstrual period 08/29/2011, SpO2 99 %. General appearance: alert, cooperative and no distress Resp: clear to auscultation bilaterally Cardio: regular rate and rhythm GI: soft, non-tender; bowel sounds normal; no masses,  no organomegaly Extremities: extremities normal, atraumatic, no cyanosis or edema  Wound with ecchymosis but no drainage and intacgt  Disposition: Discharge disposition: 01-Home or Self Care       Discharge Instructions    Call MD for:  persistant dizziness or light-headedness   Complete by:  As directed    Diet - low sodium heart healthy   Complete by:  As directed    Discharge instructions   Complete by:  As directed    05/20/2018  Hold off on antiinflammatories until approved by gastroenterology.  Activity: Refer to prior discharge instructions given to you by Dr. Denman George during the prior admission  Reasons to call the Doctor:  Fever - Oral temperature greater than 100.4 degrees Fahrenheit Foul-smelling vaginal  discharge Difficulty urinating Nausea and vomiting Increased pain at the site of the incision that is unrelieved with pain medicine. Difficulty breathing with or without chest pain New calf pain especially if only on one side Sudden, continuing increased vaginal bleeding with or without clots.   Follow-up: 1. See Everitt Amber as scheduled  Contacts: For questions or concerns you should contact:  Dr. Everitt Amber at [phone] After hours and on week-ends call [phone] and ask to speak to the physician on call for Gynecologic Oncology   Increase activity slowly   Complete by:  As directed    Lifting restrictions   Complete by:  As directed    Do not lift, pull, or pull anything more than 5 pounds     Allergies as of 05/20/2018      Reactions   Codeine    REACTION: vomiting      Medication List    STOP taking these medications   ibuprofen 400 MG tablet Commonly known as:  ADVIL,MOTRIN     TAKE these medications   acetaminophen 500 MG tablet Commonly known as:  TYLENOL Take 2 tablets (1,000 mg total) by mouth every 8 (eight) hours.   DULoxetine 60 MG capsule Commonly known as:  CYMBALTA Take 120 mg by mouth daily.   ferrous gluconate 324 MG tablet Commonly known as:  FERGON Take 1 tablet (324 mg total) by mouth 2 (two) times daily with a meal.   oxyCODONE-acetaminophen 5-325 MG tablet Commonly known as:  PERCOCET/ROXICET Take 1-2 tablets by mouth every 4 (four) hours as needed for severe pain.   pantoprazole 20 MG tablet Commonly known as:  PROTONIX Take 1 tablet (  20 mg total) by mouth daily.   senna 8.6 MG Tabs tablet Commonly known as:  SENOKOT Take 1 tablet (8.6 mg total) by mouth at bedtime.   traZODone 50 MG tablet Commonly known as:  DESYREL Take 100 mg by mouth at bedtime.        Signed: Isabel Caprice 05/20/2018, 9:23 AM

## 2018-05-21 LAB — CULTURE, BLOOD (ROUTINE X 2)
CULTURE: NO GROWTH
Special Requests: ADEQUATE

## 2018-05-22 LAB — CULTURE, BLOOD (ROUTINE X 2)
CULTURE: NO GROWTH
Special Requests: ADEQUATE

## 2018-05-24 ENCOUNTER — Other Ambulatory Visit: Payer: Self-pay | Admitting: Gynecologic Oncology

## 2018-05-24 ENCOUNTER — Telehealth: Payer: Self-pay

## 2018-05-24 DIAGNOSIS — D509 Iron deficiency anemia, unspecified: Secondary | ICD-10-CM

## 2018-05-24 MED ORDER — FERROUS SULFATE 325 (65 FE) MG PO TBEC: 325.0000 mg | DELAYED_RELEASE_TABLET | Freq: Three times a day (TID) | ORAL | 3 refills | Status: DC

## 2018-05-24 NOTE — Progress Notes (Signed)
Patient called reporting stomach upset (gas, bowel issues) with the ferrous gluconate.  Wanting a different type.  RN to call patient to discuss.

## 2018-05-24 NOTE — Telephone Encounter (Signed)
Outgoing call per Joylene John NP regarding pt reported abd pain and gas pain with taking iron pills.  Spoke with pt, per [facility] NP, she has called in new iron prescription and stop other iron pill and try new prescription to see if symptoms improve, was ordered as three times a day but she can start with twice a day.  Also per Melissa NP, start taking Colace or stool softner once a day to help keep stools soft.  Pt voiced understanding and reports she has been having regular bms.  Encouraged pt to call our office back if continues having abd symptoms with taking iron.  Pt voiced understanding and no other issues per pt at this time.

## 2018-05-29 ENCOUNTER — Telehealth: Payer: Self-pay | Admitting: Gynecologic Oncology

## 2018-05-29 NOTE — Telephone Encounter (Signed)
Called patient to notify her that her FMLA forms had been completed on August 8 and were faxed.  Advised would fax again since received fax of the blank forms.  No concerns voiced.  Patient stating she is doing well. Follow up appt arranged for the 28th.  Advised to call for any needs.

## 2018-06-07 ENCOUNTER — Inpatient Hospital Stay: Payer: 59 | Attending: Gynecologic Oncology | Admitting: Gynecologic Oncology

## 2018-06-07 ENCOUNTER — Encounter: Payer: Self-pay | Admitting: Gynecologic Oncology

## 2018-06-07 ENCOUNTER — Inpatient Hospital Stay: Payer: 59

## 2018-06-07 VITALS — BP 117/84 | HR 98 | Temp 98.0°F | Resp 20 | Ht 64.0 in | Wt 127.2 lb

## 2018-06-07 DIAGNOSIS — Z90722 Acquired absence of ovaries, bilateral: Secondary | ICD-10-CM | POA: Insufficient documentation

## 2018-06-07 DIAGNOSIS — Z90710 Acquired absence of both cervix and uterus: Secondary | ICD-10-CM | POA: Insufficient documentation

## 2018-06-07 DIAGNOSIS — D508 Other iron deficiency anemias: Secondary | ICD-10-CM

## 2018-06-07 DIAGNOSIS — R1900 Intra-abdominal and pelvic swelling, mass and lump, unspecified site: Secondary | ICD-10-CM | POA: Insufficient documentation

## 2018-06-07 DIAGNOSIS — D500 Iron deficiency anemia secondary to blood loss (chronic): Secondary | ICD-10-CM

## 2018-06-07 LAB — CBC WITH DIFFERENTIAL (CANCER CENTER ONLY)
BASOS ABS: 0.1 10*3/uL (ref 0.0–0.1)
BASOS PCT: 1 %
Eosinophils Absolute: 0.4 10*3/uL (ref 0.0–0.5)
Eosinophils Relative: 5 %
HEMATOCRIT: 38.2 % (ref 34.8–46.6)
Hemoglobin: 12.7 g/dL (ref 11.6–15.9)
LYMPHS PCT: 26 %
Lymphs Abs: 1.9 10*3/uL (ref 0.9–3.3)
MCH: 30.9 pg (ref 25.1–34.0)
MCHC: 33.3 g/dL (ref 31.5–36.0)
MCV: 92.6 fL (ref 79.5–101.0)
MONO ABS: 0.4 10*3/uL (ref 0.1–0.9)
Monocytes Relative: 6 %
NEUTROS ABS: 4.5 10*3/uL (ref 1.5–6.5)
NEUTROS PCT: 62 %
Platelet Count: 325 10*3/uL (ref 145–400)
RBC: 4.13 MIL/uL (ref 3.70–5.45)
RDW: 15 % — AB (ref 11.2–14.5)
WBC Count: 7.2 10*3/uL (ref 3.9–10.3)

## 2018-06-07 NOTE — Progress Notes (Signed)
Follow-up Note: Gyn-Onc  CC:  Chief Complaint  Patient presents with  . Pelvic mass in female    Assessment/Plan:  [name]  is a 57 y.o.  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  Nathan is doing well postoperatively.  We will recheck her CBC today to verify if she can come off iron replacement therapy.  I discussed surgical findings and pathologic findings.  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  We discussed that no additional therapy for this is necessary.  She does not require ongoing Pap surveillance.  She will follow-up with me on a as needed basis.  HPI: Jacqueline Castro is a 57 year old P3 who is returning to the office for postoperative visit.  Her history with me began on May 13, 2018 when I was consulted for a large abdominopelvic mass measuring 16 cm with intraperitoneal free fluid consistent with blood.  There was concern that she had a bleeding ovarian cancer.  She was taken to the operating room somewhat emergently that night after blood transfusion.  An expiratory laparotomy was performed and intraoperative findings were significant for a torsed 20 cm left ovarian solid mass.  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  Given the concern for possible occult granulosa cell tumor a total hysterectomy and contralateral left salpingo-nephrectomy were also performed.  While there was a large amount of hemoperitoneum there was no additional significant blood loss during the surgery.  She returned to the ICU from the OR and then did extremely well over the course of 3 days and discharged on postoperative day 3 with a stable hemoglobin of 12.0.  She was readmitted within 24 hours of discharge with a drop in hemoglobin to 8.0 and symptoms of  hypovolemic shock.  A CT abdomen and pelvis on the same day revealed free intraperitoneal fluid consistent with hemoperitoneum, with no clear source of bleeding.  She received 2 units of blood transfusion and her hemoglobin appropriately responded and remained stable throughout the course of her hospitalization.  She was discharged home on postoperative day 4 with a stable hemoglobin.  During that hospitalization she had one episode of blood streaked stools.  A GI consult was performed.  She had no ongoing bloody stools and therefore a GI scope was not performed as an inpatient.  Interval History: She is doing well at home and has regained her energy levels and strength.  She denies pain.  Her GI function is normal.  She denies bloody stools.  She is scheduled for a colonoscopy.  Current Meds:  Outpatient Encounter Medications as of 06/07/2018  Medication Sig  . DULoxetine (CYMBALTA) 60 MG capsule Take 120 mg by mouth daily.  . ferrous sulfate 325 (65 FE) MG EC tablet Take 1 tablet (325 mg total) by mouth 3 (three) times daily with meals.  . traZODone (DESYREL) 50 MG tablet Take 100 mg by mouth at bedtime.  . [DISCONTINUED] acetaminophen (TYLENOL) 500 MG tablet Take 2 tablets (1,000 mg total) by mouth every 8 (eight) hours. (Patient not taking: Reported on 06/07/2018)  . [DISCONTINUED] oxyCODONE-acetaminophen (PERCOCET) 5-325 MG tablet Take 1-2 tablets by mouth every 4 (four) hours as needed for severe pain. (Patient not taking: Reported on 06/07/2018)  . [DISCONTINUED] pantoprazole (PROTONIX) 20 MG tablet Take 1 tablet (20 mg  total) by mouth daily. (Patient not taking: Reported on 05/12/2018)  . [DISCONTINUED] senna (SENOKOT) 8.6 MG TABS tablet Take 1 tablet (8.6 mg total) by mouth at bedtime. (Patient not taking: Reported on 06/07/2018)   No facility-administered encounter medications on file as of 06/07/2018.     Allergy:  Allergies  Allergen Reactions  . Codeine     REACTION: vomiting    Social  Hx:   Social History   Socioeconomic History  . Marital status: Married    Spouse name: Not on file  . Number of children: 4  . Years of education: Not on file  . Highest education level: Not on file  Occupational History  . Occupation: Armed forces logistics/support/administrative officer: [employer]  . Financial resource strain: Not on file  . Food insecurity:    Worry: Not on file    Inability: Not on file  . Transportation needs:    Medical: Not on file    Non-medical: Not on file  Tobacco Use  . Smoking status: Never Smoker  . Smokeless tobacco: Never Used  Substance and Sexual Activity  . Alcohol use: No    Comment: occasionally  . Drug use: No  . Sexual activity: Not on file  Lifestyle  . Physical activity:    Days per week: Not on file    Minutes per session: Not on file  . Stress: Not on file  Relationships  . Social connections:    Talks on phone: Not on file    Gets together: Not on file    Attends religious service: Not on file    Active member of club or organization: Not on file    Attends meetings of clubs or organizations: Not on file    Relationship status: Not on file  . Intimate partner violence:    Fear of current or ex partner: Not on file    Emotionally abused: Not on file    Physically abused: Not on file    Forced sexual activity: Not on file  Other Topics Concern  . Not on file  Social History Narrative  . Not on file    Past Surgical Hx:  Past Surgical History:  Procedure Laterality Date  . CESAREAN SECTION    . LAPAROTOMY N/A 05/13/2018   Procedure: EXPLORATORY LAPAROTOMY, TOTAL ABDOMINAL HYSTERECTOMY, BILATERAL SALPINGO-OOPHORECTOMY, OMENTECTOMY;  Surgeon: Everitt Amber, MD;  Location: [facility];  Service: Gynecology;  Laterality: N/A;  . LUMBAR DISC SURGERY     Neck  . TUBAL LIGATION      Past Medical Hx:  Past Medical History:  Diagnosis Date  . Anemia   . Anxiety   . Chest pain   . Palpitations   . Proctitis   . Ulcerative colitis      Past Gynecological History:   Patient's last menstrual period was 08/29/2011.  Family Hx:  Family History  Problem Relation Age of Onset  . Diabetes Mother   . Colon cancer Paternal Grandfather 55  . Colon cancer Paternal Uncle 2  . Heart Problems Sister        bicuspid aortic valve    Review of Systems:  Constitutional  Feels well,    ENT Normal appearing ears and nares bilaterally Skin/Breast  No rash, sores, jaundice, itching, dryness Cardiovascular  No chest pain, shortness of breath, or edema  Pulmonary  No cough or wheeze.  Gastro Intestinal  No nausea, vomitting, or diarrhoea. No bright red blood per rectum, no abdominal  pain, change in bowel movement, or constipation.  Genito Urinary  No frequency, urgency, dysuria, no bleeding Musculo Skeletal  No myalgia, arthralgia, joint swelling or pain  Neurologic  No weakness, numbness, change in gait,  Psychology  No depression, anxiety, insomnia.   Vitals:  Blood pressure 117/84, pulse 98, temperature 98 F (36.7 C), temperature source Oral, resp. rate 20, height 5' 4"  (1.626 m), weight 127 lb 3.2 oz (57.7 kg), last menstrual period 08/29/2011, SpO2 99 %.  Physical Exam: WD in NAD Skin  No rash/lesions/breakdown  Psychiatry  Alert and oriented to person, place, and time  Abdomen  Normoactive bowel sounds, abdomen soft, non-tender and thin without evidence of hernia. Well healed incision Back No CVA tenderness Genito Urinary  Vulva/vagina: well healed vaginal cuff Rectal  deferred Extremities  No bilateral cyanosis, clubbing or edema.   Thereasa Solo, MD  06/07/2018, 5:37 PM

## 2018-06-07 NOTE — Patient Instructions (Signed)
Please call Dr Denman George at [phone] with any questions related to your surgery. The name of your tumor was fibrothecoma. It was of the left ovary. The right ovary, tube and uterus were normal.  The cervix was also removed.  Please refrain from intercourse until 07/13/18 or later.  You can return to work as planned on 06/26/18.   Avoid heavy lifting prior to 06/13/18.  You can swim or submerge in a tub bath.

## 2018-06-08 ENCOUNTER — Telehealth: Payer: Self-pay

## 2018-06-08 NOTE — Telephone Encounter (Signed)
Told Jacqueline Castro that Dr. Denman George said she could stop her iron tablets now as her Hemoglobin is normal now as noted below by Dr. Denman George.  Patient verbalized understanding.

## 2018-06-08 NOTE — Telephone Encounter (Signed)
-----   Message from Everitt Amber, MD sent at 06/08/2018  7:46 AM EDT ----- Hemoglobin normal. She can stop her iron tablets now.  Terrence Dupont  ----- Message ----- From: Buel Ream, [facility] [place] Sent: 06/07/2018   3:35 PM EDT To: Everitt Amber, MD

## 2018-06-23 ENCOUNTER — Telehealth: Payer: Self-pay

## 2018-06-23 NOTE — Telephone Encounter (Signed)
Pt states that she is experiencing pain on her right side under her rib. She experienced this just after her surgery in early August.. Pt states that she began using the elliptical machine this week.  She was not hurting prior to beginning this exercise.The area is tender to the touch. Reviewed with Joylene John, NP. Advised patient to stop using the elliptica.  Use Ibuprofen alt. with Tylenol as needed for pain.  She can apply ice and heat. She is to call the office on Monday 06-26-18 with an update.

## 2018-12-18 ENCOUNTER — Telehealth: Payer: Self-pay

## 2018-12-18 NOTE — Telephone Encounter (Signed)
Jacqueline Castro states that she has been experiencing left sided alt. right side abdominal discomfort intermittently  since her surgery in August of 2019. In the last 3 weeks the pain is increasing in frequency and intensity.  She experienced a very sharp pain on the right side of her abdomen that radiated down to her vagina.  This will make her stop what she is doing for a few minutes until the pain eases. Reviewed with Melissa Cross,NP. She recommended That she come in to be evaluated by Dr. Denman George on 12-22-18. Pt scheduled for 3:15 pm on 12-22-18.

## 2018-12-21 NOTE — Progress Notes (Signed)
Follow-up Note: Gyn-Onc  CC:  Chief Complaint  Patient presents with  . Fibrothecoma of ovary  . Abdominal Pain    Assessment/Plan:  [name]  is a 58 y.o.  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  Jacqueline Castro has abdominal pain post-op unexplained by physical exam findings.  I am recommending a CT abd/pelvis to evaluate for possible underlying source.   If negative, the explanation is most likely adhesive disease.   HPI: Jacqueline Castro is a 58 year old P3 who is returning to the office for postoperative visit.  Her history with me began on May 13, 2018 when I was consulted for a large abdominopelvic mass measuring 16 cm with intraperitoneal free fluid consistent with blood.  There was concern that she had a bleeding ovarian cancer.  She was taken to the operating room somewhat emergently that night after blood transfusion.  An expiratory laparotomy was performed and intraoperative findings were significant for a torsed 20 cm left ovarian solid mass.  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  Given the concern for possible occult granulosa cell tumor a total hysterectomy and contralateral left salpingo-nephrectomy were also performed.  While there was a large amount of hemoperitoneum there was no additional significant blood loss during the surgery.  She returned to the ICU from the OR and then did extremely well over the course of 3 days and discharged on postoperative day 3 with a stable hemoglobin of 12.0.  She was readmitted within 24 hours of discharge with a drop in hemoglobin to 8.0 and symptoms of hypovolemic shock.  A CT abdomen and pelvis on the same day revealed free intraperitoneal fluid consistent with hemoperitoneum, with no clear source of bleeding.  She  received 2 units of blood transfusion and her hemoglobin appropriately responded and remained stable throughout the course of her hospitalization.  She was discharged home on postoperative day 4 with a stable hemoglobin.  During that hospitalization she had one episode of blood streaked stools.  A GI consult was performed.  She had no ongoing bloody stools and therefore a GI scope was not performed as an inpatient.  Interval Hx:  Since surgery she reported feeling mild twinges of pain in the bilateral pelvis.  Since January 2020 these pains have become increased in intensity and she feels the more strongly slightly higher in the abdomen and the abdominal wall in the left and right adjacent but not involving the incision.  They are not worse with passing a bowel movement or eating.  They are more noticeable when she strains to void urine.  They are worse when she lies on her side but improved when she lies flat.  She has had occasional passage of bloody mucus from the vagina.  Current Meds:  Outpatient Encounter Medications as of 12/22/2018  Medication Sig  . DULoxetine (CYMBALTA) 60 MG capsule Take 120 mg by mouth daily.  . traZODone (DESYREL) 50 MG tablet Take 100 mg by mouth at bedtime.   No facility-administered encounter medications on file as of 12/22/2018.     Allergy:  Allergies  Allergen Reactions  . Codeine     REACTION: vomiting    Social Hx:   Social History   Socioeconomic History  . Marital status: Married    Spouse name: Not on file  .  Number of children: 4  . Years of education: Not on file  . Highest education level: Not on file  Occupational History  . Occupation: Armed forces logistics/support/administrative officer: [employer]  . Financial resource strain: Not on file  . Food insecurity:    Worry: Not on file    Inability: Not on file  . Transportation needs:    Medical: Not on file    Non-medical: Not on file  Tobacco Use  . Smoking status: Never Smoker  . Smokeless  tobacco: Never Used  Substance and Sexual Activity  . Alcohol use: No    Comment: occasionally  . Drug use: No  . Sexual activity: Not on file  Lifestyle  . Physical activity:    Days per week: Not on file    Minutes per session: Not on file  . Stress: Not on file  Relationships  . Social connections:    Talks on phone: Not on file    Gets together: Not on file    Attends religious service: Not on file    Active member of club or organization: Not on file    Attends meetings of clubs or organizations: Not on file    Relationship status: Not on file  . Intimate partner violence:    Fear of current or ex partner: Not on file    Emotionally abused: Not on file    Physically abused: Not on file    Forced sexual activity: Not on file  Other Topics Concern  . Not on file  Social History Narrative  . Not on file    Past Surgical Hx:  Past Surgical History:  Procedure Laterality Date  . CESAREAN SECTION    . LAPAROTOMY N/A 05/13/2018   Procedure: EXPLORATORY LAPAROTOMY, TOTAL ABDOMINAL HYSTERECTOMY, BILATERAL SALPINGO-OOPHORECTOMY, OMENTECTOMY;  Surgeon: Everitt Amber, MD;  Location: [facility];  Service: Gynecology;  Laterality: N/A;  . LUMBAR DISC SURGERY     Neck  . TUBAL LIGATION      Past Medical Hx:  Past Medical History:  Diagnosis Date  . Anemia   . Anxiety   . Chest pain   . Palpitations   . Proctitis   . Ulcerative colitis     Past Gynecological History:   Patient's last menstrual period was 08/29/2011.  Family Hx:  Family History  Problem Relation Age of Onset  . Diabetes Mother   . Colon cancer Paternal Grandfather 55  . Colon cancer Paternal Uncle 22  . Heart Problems Sister        bicuspid aortic valve    Review of Systems:  Constitutional  Feels well,    ENT Normal appearing ears and nares bilaterally Skin/Breast  No rash, sores, jaundice, itching, dryness Cardiovascular  No chest pain, shortness of breath, or edema  Pulmonary  No cough or  wheeze.  Gastro Intestinal  No nausea, vomitting, or diarrhoea. No bright red blood per rectum, no abdominal pain, change in bowel movement, or constipation.  Genito Urinary  No frequency, urgency, dysuria, occasional bloody mucous.  Musculo Skeletal  No myalgia, arthralgia, joint swelling or pain  Neurologic  No weakness, numbness, change in gait,  Psychology  No depression, anxiety, insomnia.   Vitals:  Blood pressure 128/88, pulse 80, temperature 98.3 F (36.8 C), temperature source Oral, resp. rate 20, height 5' 5"  (1.651 m), weight 137 lb (62.1 kg), last menstrual period 08/29/2011, SpO2 100 %.  Physical Exam: WD in NAD Skin  No rash/lesions/breakdown  Psychiatry  Alert and oriented to person, place, and time  Abdomen  Normoactive bowel sounds, abdomen soft, non-tender and thin without evidence of hernia. Well healed incision. No palpable masses.  Back No CVA tenderness Genito Urinary  Vulva/vagina: well healed vaginal cuff, granulation tissue at left aspect which was cauterized with silver nitrate. No palpable masses. Rectal  deferred Extremities  No bilateral cyanosis, clubbing or edema.   Thereasa Solo, MD  12/22/2018, 3:20 PM

## 2018-12-22 ENCOUNTER — Encounter: Payer: Self-pay | Admitting: Gynecologic Oncology

## 2018-12-22 ENCOUNTER — Other Ambulatory Visit: Payer: Self-pay

## 2018-12-22 ENCOUNTER — Inpatient Hospital Stay: Payer: 59 | Attending: Gynecologic Oncology | Admitting: Gynecologic Oncology

## 2018-12-22 VITALS — BP 128/88 | HR 80 | Temp 98.3°F | Resp 20 | Ht 65.0 in | Wt 137.0 lb

## 2018-12-22 DIAGNOSIS — Z90710 Acquired absence of both cervix and uterus: Secondary | ICD-10-CM

## 2018-12-22 DIAGNOSIS — Z79899 Other long term (current) drug therapy: Secondary | ICD-10-CM | POA: Diagnosis not present

## 2018-12-22 DIAGNOSIS — D279 Benign neoplasm of unspecified ovary: Secondary | ICD-10-CM | POA: Insufficient documentation

## 2018-12-22 DIAGNOSIS — R1032 Left lower quadrant pain: Secondary | ICD-10-CM | POA: Insufficient documentation

## 2018-12-22 DIAGNOSIS — Z90722 Acquired absence of ovaries, bilateral: Secondary | ICD-10-CM | POA: Insufficient documentation

## 2018-12-22 NOTE — Patient Instructions (Signed)
Dr Denman George thinks that the pain in the abdomen might be from scar tissue that formed after surgery. She is scheduling a CT scan to look inside the abdomen and pelvis to ensure there are no other explanations for the pain. Her office will contact you with the results. They can be reached at [phone].

## 2018-12-27 ENCOUNTER — Telehealth: Payer: Self-pay

## 2018-12-27 ENCOUNTER — Telehealth: Payer: Self-pay | Admitting: *Deleted

## 2018-12-27 NOTE — Telephone Encounter (Signed)
Outgoing call to patient to return her call regarding last note from [facility].  Per Joylene John NP, "no, does not need to have CT if her pain is better"  Voiced understanding and wishes to cancel CT, denies pain.  Reports still having light pink spotting - is that normal?  She reported she had area cauterized, told her to continue to monitor spotting and if increases or pain returns,  call office. Made Joylene John NP aware. Pt voiced understanding. No other needs per pt at this time.

## 2018-12-27 NOTE — Telephone Encounter (Signed)
Patient called and stated "I saw Dr. Denman George last week and was having some post op pain. She ordered a CT scan, that is tomorrow. I have had no pain lately. Do I need to still come to the appt. Also I am having some spotting, is this normal?" The patient can be reached at [phone]. Patient had  granulation tissue at left aspect which was cauterized with silver nitrate on 12/22/2018

## 2018-12-28 ENCOUNTER — Encounter (HOSPITAL_COMMUNITY): Payer: Self-pay

## 2018-12-28 ENCOUNTER — Ambulatory Visit (HOSPITAL_COMMUNITY): Admission: RE | Admit: 2018-12-28 | Payer: 59 | Source: Ambulatory Visit

## 2019-07-03 ENCOUNTER — Other Ambulatory Visit: Payer: Self-pay

## 2019-07-03 ENCOUNTER — Ambulatory Visit (INDEPENDENT_AMBULATORY_CARE_PROVIDER_SITE_OTHER): Payer: 59 | Admitting: Gastroenterology

## 2019-07-03 ENCOUNTER — Encounter: Payer: Self-pay | Admitting: Gastroenterology

## 2019-07-03 VITALS — BP 120/80 | HR 86 | Ht 64.0 in | Wt 134.8 lb

## 2019-07-03 DIAGNOSIS — K5120 Ulcerative (chronic) proctitis without complications: Secondary | ICD-10-CM | POA: Diagnosis not present

## 2019-07-03 DIAGNOSIS — K6289 Other specified diseases of anus and rectum: Secondary | ICD-10-CM | POA: Diagnosis not present

## 2019-07-03 MED ORDER — NA SULFATE-K SULFATE-MG SULF 17.5-3.13-1.6 GM/177ML PO SOLN: 1.0000 | Freq: Once | ORAL | 0 refills | Status: AC

## 2019-07-03 NOTE — Patient Instructions (Signed)
You have been scheduled for a colonoscopy. Please follow written instructions given to you at your visit today.  Please pick up your prep supplies at the pharmacy within the next 1-3 days. If you use inhalers (even only as needed), please bring them with you on the day of your procedure. Your physician has requested that you go to www.startemmi.com and enter the access code given to you at your visit today. This web site gives a general overview about your procedure. However, you should still follow specific instructions given to you by our office regarding your preparation for the procedure.  Thank you for choosing me and [facility].  Malcolm T. [name], Jr., MD., FACG  

## 2019-07-03 NOTE — Progress Notes (Signed)
History of Present Illness: This is a 58 year old female referred by [name], MD for the evaluation of intermittent rectal pain.  She has a history of ulcerative proctitis.  She relates problems for the past year or so with intermittent, brief, sharp rectal pain that lasts less than 30 seconds and then completely resolves.  This is not associated with any other digestive symptoms and is not associated with any particular activity. It is seemingly random.  She has a history of ulcerative proctitis initially diagnosed in 2005.  Asymptomatic for years and then she presented again in 2012 with active symptoms.  Her last office visit and colonoscopy were in 2012.  She states she discontinued UC medications after several weeks when symptoms resolved and did not return for recommended follow up.  Denies weight loss, abdominal pain, constipation, diarrhea, change in stool caliber, melena, hematochezia, nausea, vomiting, dysphagia, reflux symptoms, chest pain.     Allergies  Allergen Reactions  . Codeine     REACTION: vomiting   Outpatient Medications Prior to Visit  Medication Sig Dispense Refill  . ALPRAZolam (XANAX) 0.5 MG tablet TAKE UP TO 1 TABLET BY MOUTH AS NEEDED EVERY DAY FOR ANXIETY/PANIC    . DULoxetine (CYMBALTA) 60 MG capsule Take 120 mg by mouth daily.    . traZODone (DESYREL) 50 MG tablet Take 100 mg by mouth at bedtime.    . tretinoin (RETIN-A) 0.1 % cream APPLY TO AFFECTED AREA EVERY DAY AT NIGHT     No facility-administered medications prior to visit.    Past Medical History:  Diagnosis Date  . Anemia   . Anxiety   . Chest pain   . Palpitations   . Proctitis   . Ulcerative colitis    Past Surgical History:  Procedure Laterality Date  . CESAREAN SECTION    . LAPAROTOMY N/A 05/13/2018   Procedure: EXPLORATORY LAPAROTOMY, TOTAL ABDOMINAL HYSTERECTOMY, BILATERAL SALPINGO-OOPHORECTOMY, OMENTECTOMY;  Surgeon: Everitt Amber, MD;  Location: [facility];  Service: Gynecology;   Laterality: N/A;  . LUMBAR DISC SURGERY     Neck  . TUBAL LIGATION     Social History   Socioeconomic History  . Marital status: Married    Spouse name: Not on file  . Number of children: 4  . Years of education: Not on file  . Highest education level: Not on file  Occupational History  . Occupation: Armed forces logistics/support/administrative officer: [employer]  . Financial resource strain: Not on file  . Food insecurity    Worry: Not on file    Inability: Not on file  . Transportation needs    Medical: Not on file    Non-medical: Not on file  Tobacco Use  . Smoking status: Never Smoker  . Smokeless tobacco: Never Used  Substance and Sexual Activity  . Alcohol use: No    Comment: occasionally  . Drug use: No  . Sexual activity: Not on file  Lifestyle  . Physical activity    Days per week: Not on file    Minutes per session: Not on file  . Stress: Not on file  Relationships  . Social Herbalist on phone: Not on file    Gets together: Not on file    Attends religious service: Not on file    Active member of club or organization: Not on file    Attends meetings of clubs or organizations: Not on file    Relationship status:  Not on file  Other Topics Concern  . Not on file  Social History Narrative  . Not on file   Family History  Problem Relation Age of Onset  . Diabetes Mother   . Colon cancer Paternal Grandfather 63  . Colon cancer Paternal Uncle 13  . Heart Problems Sister        bicuspid aortic valve       Review of Systems: Pertinent positive and negative review of systems were noted in the above HPI section. All other review of systems were otherwise negative.    Physical Exam: General: Well developed, well nourished, no acute distress Head: Normocephalic and atraumatic Eyes:  sclerae anicteric, EOMI Ears: Normal auditory acuity Mouth: No deformity or lesions Neck: Supple, no masses or thyromegaly Lungs: Clear throughout to auscultation Heart:  Regular rate and rhythm; no murmurs, rubs or bruits Abdomen: Soft, non tender and non distended. No masses, hepatosplenomegaly or hernias noted. Normal Bowel sounds Rectal: Deferred to colonoscopy Musculoskeletal: Symmetrical with no gross deformities  Skin: No lesions on visible extremities Pulses:  Normal pulses noted Extremities: No clubbing, cyanosis, edema or deformities noted Neurological: Alert oriented x 4, grossly nonfocal Cervical Nodes:  No significant cervical adenopathy Inguinal Nodes: No significant inguinal adenopathy Psychological:  Alert and cooperative. Normal mood and affect   Assessment and Recommendations:  1.  Intermittent brief rectal pain.  Suspected proctalgia fugax.  Reassurance provided.    2. Ulcerative proctitis since at least 2005.  Asymptomatic for years.  Assess colitis activity, screen for colon cancer.  Schedule colonoscopy. The risks (including bleeding, perforation, infection, missed lesions, medication reactions and possible hospitalization or surgery if complications occur), benefits, and alternatives to colonoscopy with possible biopsy and possible polypectomy were discussed with the patient and they consent to proceed.     cc: [name], [address],  [place] [id number]

## 2019-07-23 ENCOUNTER — Encounter: Payer: Self-pay | Admitting: Gastroenterology

## 2019-07-25 ENCOUNTER — Telehealth: Payer: Self-pay

## 2019-07-25 NOTE — Telephone Encounter (Signed)
Covid-19 screening questions   Do you now or have you had a fever in the last 14 days?  Do you have any respiratory symptoms of shortness of breath or cough now or in the last 14 days?  Do you have any family members or close contacts with diagnosed or suspected Covid-19 in the past 14 days?  Have you been tested for Covid-19 and found to be positive?       

## 2019-07-25 NOTE — Telephone Encounter (Signed)
Pt responded "no" to all screening questions °

## 2019-07-26 ENCOUNTER — Encounter: Payer: Self-pay | Admitting: Gastroenterology

## 2019-07-26 ENCOUNTER — Other Ambulatory Visit: Payer: Self-pay

## 2019-07-26 ENCOUNTER — Ambulatory Visit (AMBULATORY_SURGERY_CENTER): Payer: 59 | Admitting: Gastroenterology

## 2019-07-26 VITALS — BP 120/76 | HR 66 | Temp 98.2°F | Resp 14 | Ht 64.0 in | Wt 134.0 lb

## 2019-07-26 DIAGNOSIS — K635 Polyp of colon: Secondary | ICD-10-CM

## 2019-07-26 DIAGNOSIS — K5120 Ulcerative (chronic) proctitis without complications: Secondary | ICD-10-CM | POA: Diagnosis present

## 2019-07-26 DIAGNOSIS — K6289 Other specified diseases of anus and rectum: Secondary | ICD-10-CM | POA: Diagnosis not present

## 2019-07-26 DIAGNOSIS — D123 Benign neoplasm of transverse colon: Secondary | ICD-10-CM

## 2019-07-26 MED ORDER — SODIUM CHLORIDE 0.9 % IV SOLN: 500.0000 mL | Freq: Once | INTRAVENOUS | Status: DC

## 2019-07-26 NOTE — Patient Instructions (Signed)
Please read handouts provided. Await pathology results. Continue present medications.      YOU HAD AN ENDOSCOPIC PROCEDURE TODAY AT [facility]:   Refer to the procedure report that was given to you for any specific questions about what was found during the examination.  If the procedure report does not answer your questions, please call your gastroenterologist to clarify.  If you requested that your care partner not be given the details of your procedure findings, then the procedure report has been included in a sealed envelope for you to review at your convenience later.  YOU SHOULD EXPECT: Some feelings of bloating in the abdomen. Passage of more gas than usual.  Walking can help get rid of the air that was put into your GI tract during the procedure and reduce the bloating. If you had a lower endoscopy (such as a colonoscopy or flexible sigmoidoscopy) you may notice spotting of blood in your stool or on the toilet paper. If you underwent a bowel prep for your procedure, you may not have a normal bowel movement for a few days.  Please Note:  You might notice some irritation and congestion in your nose or some drainage.  This is from the oxygen used during your procedure.  There is no need for concern and it should clear up in a day or so.  SYMPTOMS TO REPORT IMMEDIATELY:   Following lower endoscopy (colonoscopy or flexible sigmoidoscopy):  Excessive amounts of blood in the stool  Significant tenderness or worsening of abdominal pains  Swelling of the abdomen that is new, acute  Fever of 100F or higher    For urgent or emergent issues, a gastroenterologist can be reached at any hour by calling [phone].   DIET:  We do recommend a small meal at first, but then you may proceed to your regular diet.  Drink plenty of fluids but you should avoid alcoholic beverages for 24 hours.  ACTIVITY:  You should plan to take it easy for the rest of today and you should NOT  DRIVE or use heavy machinery until tomorrow (because of the sedation medicines used during the test).    FOLLOW UP: Our staff will call the number listed on your records 48-72 hours following your procedure to check on you and address any questions or concerns that you may have regarding the information given to you following your procedure. If we do not reach you, we will leave a message.  We will attempt to reach you two times.  During this call, we will ask if you have developed any symptoms of COVID 19. If you develop any symptoms (ie: fever, flu-like symptoms, shortness of breath, cough etc.) before then, please call [phone].  If you test positive for Covid 19 in the 2 weeks post procedure, please call and report this information to [place].    If any biopsies were taken you will be contacted by phone or by letter within the next 1-3 weeks.  Please call us at [phone] if you have not heard about the biopsies in 3 weeks.    SIGNATURES/CONFIDENTIALITY: You and/or your care partner have signed paperwork which will be entered into your electronic medical record.  These signatures attest to the fact that that the information above on your After Visit Summary has been reviewed and is understood.  Full responsibility of the confidentiality of this discharge information lies with you and/or your care-partner.

## 2019-07-26 NOTE — Progress Notes (Signed)
Report to PACU, RN, vss, BBS= Clear.  

## 2019-07-26 NOTE — Progress Notes (Signed)
Called to room to assist during endoscopic procedure.  Patient ID and intended procedure confirmed with present staff. Received instructions for my participation in the procedure from the performing physician.  

## 2019-07-26 NOTE — Op Note (Signed)
[facility] Patient Name: Jacqueline Castro Procedure Date: 07/26/2019 10:42 AM MRN: [id number] Endoscopist: Ladene Artist , MD Age: 58 Referring MD:  Date of Birth: 10/03/1961 Gender: Female Account #: [id number] Procedure:                Colonoscopy Indications:              High risk colon cancer surveillance: Ulcerative                            left sided colitis of 8 (or more) years duration Medicines:                Monitored Anesthesia Care Procedure:                Pre-Anesthesia Assessment:                           - Prior to the procedure, a History and Physical                            was performed, and patient medications and                            allergies were reviewed. The patient's tolerance of                            previous anesthesia was also reviewed. The risks                            and benefits of the procedure and the sedation                            options and risks were discussed with the patient.                            All questions were answered, and informed consent                            was obtained. Prior Anticoagulants: The patient has                            taken no previous anticoagulant or antiplatelet                            agents. ASA Grade Assessment: II - A patient with                            mild systemic disease. After reviewing the risks                            and benefits, the patient was deemed in                            satisfactory condition to undergo the procedure.  After obtaining informed consent, the colonoscope                            was passed under direct vision. Throughout the                            procedure, the patient's blood pressure, pulse, and                            oxygen saturations were monitored continuously. The                            Colonoscope was introduced through the anus and                            advanced to  the the cecum, identified by                            appendiceal orifice and ileocecal valve. The                            ileocecal valve, appendiceal orifice, and rectum                            were photographed. The quality of the bowel                            preparation was good. The colonoscopy was performed                            without difficulty. The colonoscopy was somewhat                            difficult due to a tortuous colon. Scope In: 10:52:18 AM Scope Out: 11:19:59 AM Scope Withdrawal Time: 0 hours 21 minutes 3 seconds  Total Procedure Duration: 0 hours 27 minutes 41 seconds  Findings:                 The perianal and digital rectal examinations were                            normal.                           A 7 mm polyp was found in the transverse colon. The                            polyp was sessile. The polyp was removed with a                            cold snare. Resection and retrieval were complete.                           The exam was otherwise without abnormality on  direct and retroflexion views. Random biopsies                            obtained. Complications:            No immediate complications. Estimated blood loss:                            None. Estimated Blood Loss:     Estimated blood loss: none. Impression:               - One 7 mm polyp in the transverse colon, removed                            with a cold snare. Resected and retrieved.                           - The examination was otherwise normal on direct                            and retroflexion views. Recommendation:           - Repeat colonoscopy in 3 years for surveillance.                           - Patient has a contact number available for                            emergencies. The signs and symptoms of potential                            delayed complications were discussed with the                            patient. Return  to normal activities tomorrow.                            Written discharge instructions were provided to the                            patient.                           - Resume previous diet.                           - Continue present medications.                           - Await pathology results. Ladene Artist, MD 07/26/2019 11:23:57 AM This report has been signed electronically.

## 2019-07-30 ENCOUNTER — Encounter: Payer: Self-pay | Admitting: Gastroenterology

## 2019-07-30 ENCOUNTER — Telehealth: Payer: Self-pay

## 2019-07-30 ENCOUNTER — Telehealth: Payer: Self-pay | Admitting: *Deleted

## 2019-07-30 NOTE — Telephone Encounter (Signed)
  Follow up Call-  Call back number 07/26/2019  Post procedure Call Back phone  # [phone]  Permission to leave phone message Yes  Some recent data might be hidden     Patient questions:  Do you have a fever, pain , or abdominal swelling? No. Pain Score  0 *  Have you tolerated food without any problems? Yes.    Have you been able to return to your normal activities? Yes.    Do you have any questions about your discharge instructions: Diet   No. Medications  No. Follow up visit  No.  Do you have questions or concerns about your Care? No.  Actions: * If pain score is 4 or above: No action needed, pain <4.  1. Have you developed a fever since your procedure? no  2.   Have you had an respiratory symptoms (SOB or cough) since your procedure? no  3.   Have you tested positive for COVID 19 since your procedure no  4.   Have you had any family members/close contacts diagnosed with the COVID 19 since your procedure?  no   If yes to any of these questions please route to Joylene John, RN and Alphonsa Gin, Therapist, sports.

## 2019-07-30 NOTE — Telephone Encounter (Signed)
No answer, message left for the patient. 

## 2019-10-22 IMAGING — CT CT ABD-PELV W/ CM
2 of 5 series · 16 of 46 positions shown, 18 images · IV contrast (Omni 300)
Comparison: None.

CLINICAL DATA: Left lower quadrant abdominal pain beginning a few
hours ago.

EXAM:
CT ABDOMEN AND PELVIS WITH CONTRAST
TECHNIQUE: Multidetector CT imaging of the abdomen and pelvis was performed
using the standard protocol following bolus administration of
intravenous contrast.
CONTRAST:  100mL OMNIPAQUE IOHEXOL 300 MG/ML  SOLN

[Series 2: a/p w/ 5mm · axial · 0.78mm/px · z∈[+919,+1349]mm · 13 of 98 slices shown, 15 images]
[im 6/98  soft-tissue]
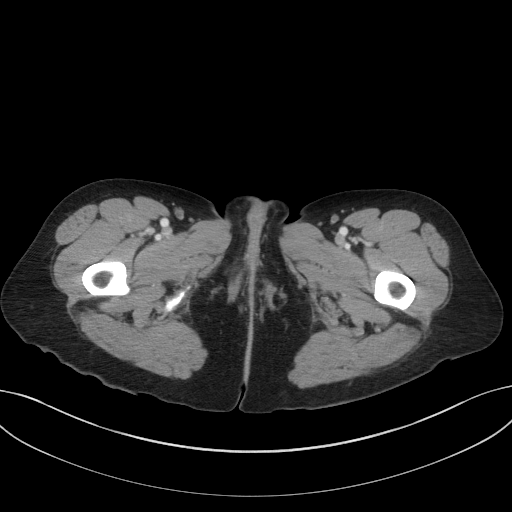
[im 6/98  bone]
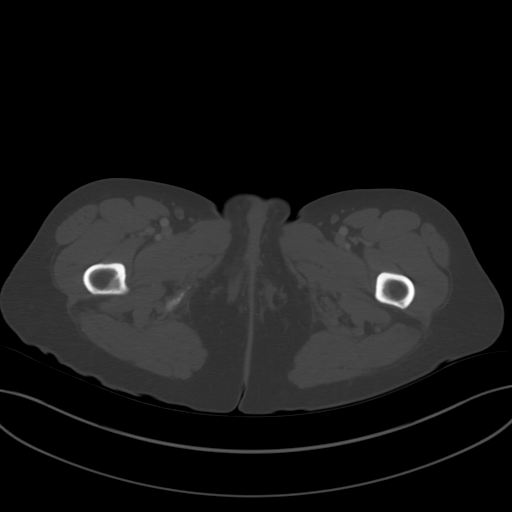
[im 16/98  soft-tissue]
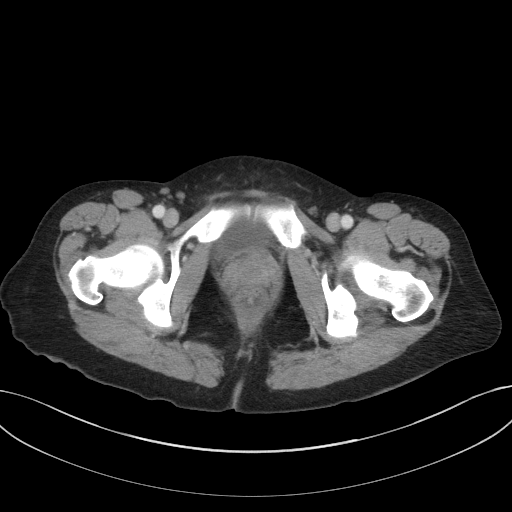
[im 21/98  soft-tissue]
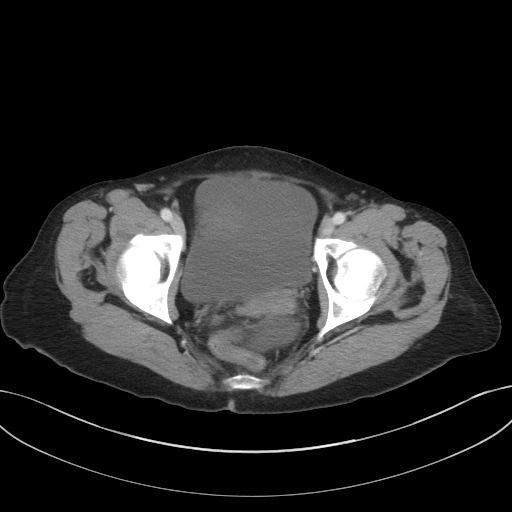
[im 26/98  soft-tissue]
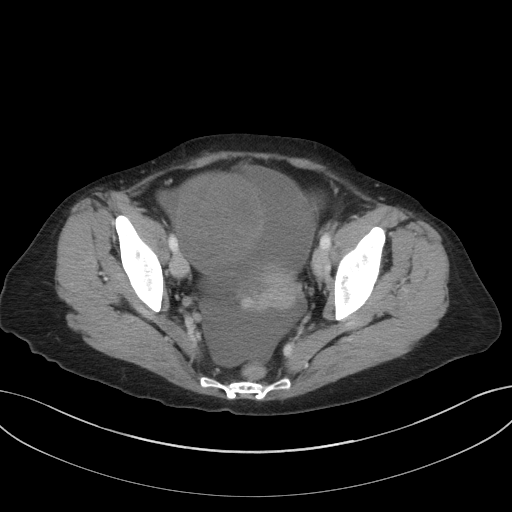
[im 36/98  soft-tissue]
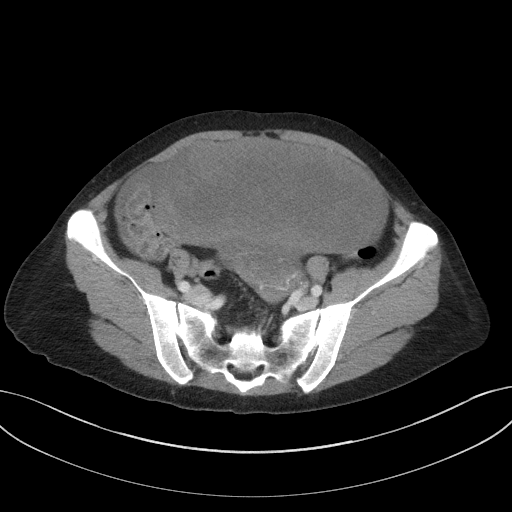
[im 41/98  soft-tissue]
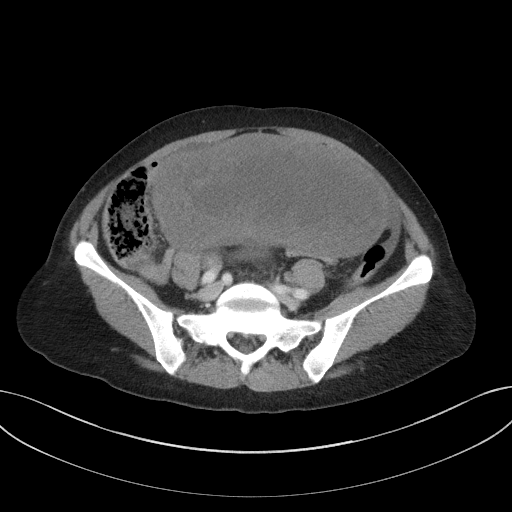
[im 52/98  soft-tissue]
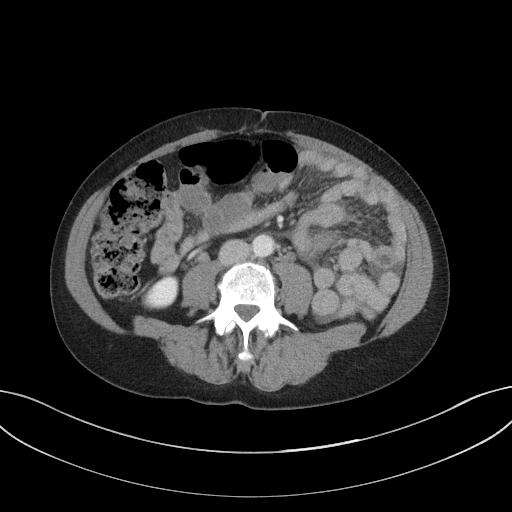
[im 57/98  soft-tissue]
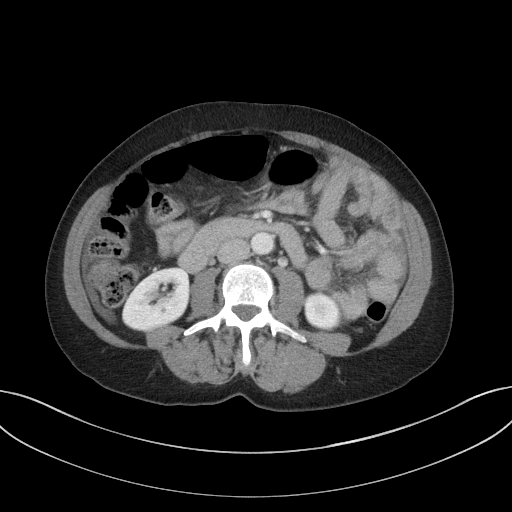
[im 62/98  soft-tissue]
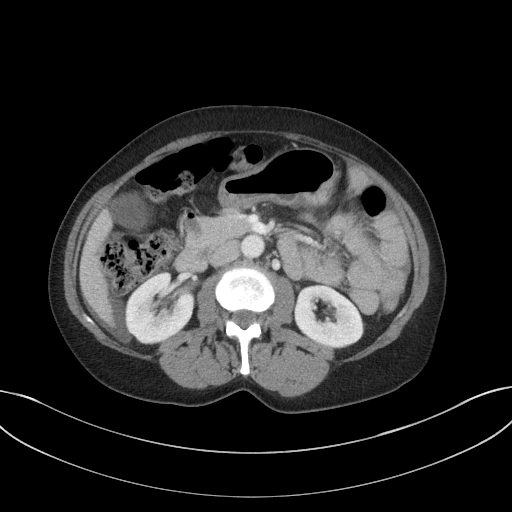
[im 62/98  bone]
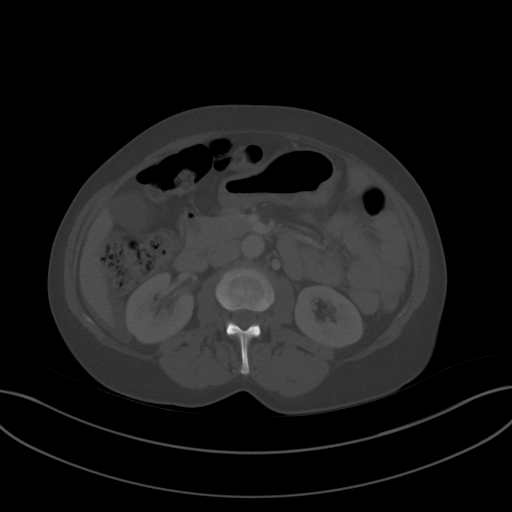
[im 72/98  soft-tissue]
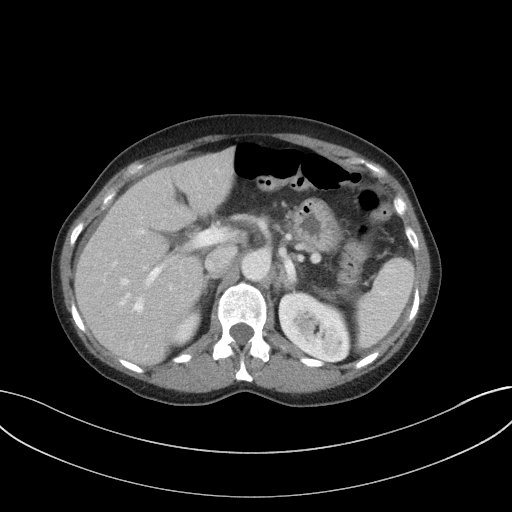
[im 77/98  soft-tissue]
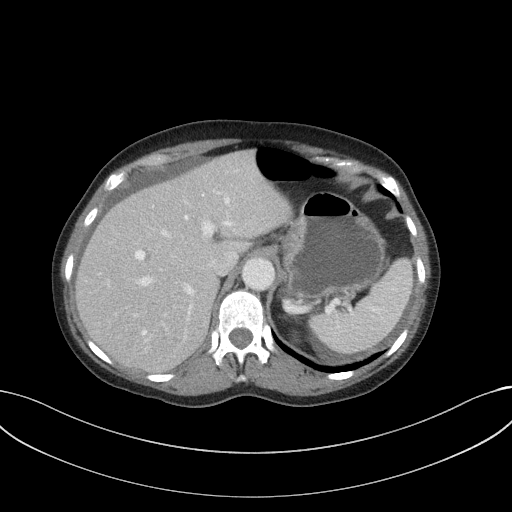
[im 82/98  soft-tissue]
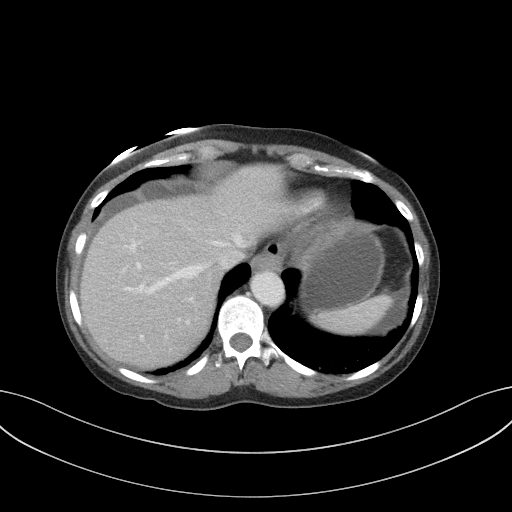
[im 92/98  soft-tissue]
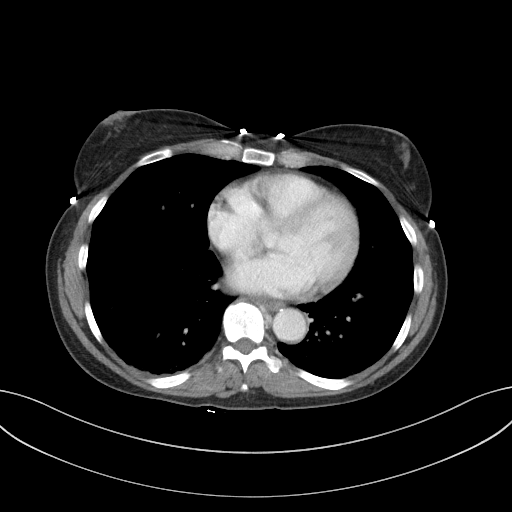

[Series 5: a/p w/ cor · coronal · 0.73mm/px · 3 of 119 slices shown]
[im 40/119  soft-tissue]
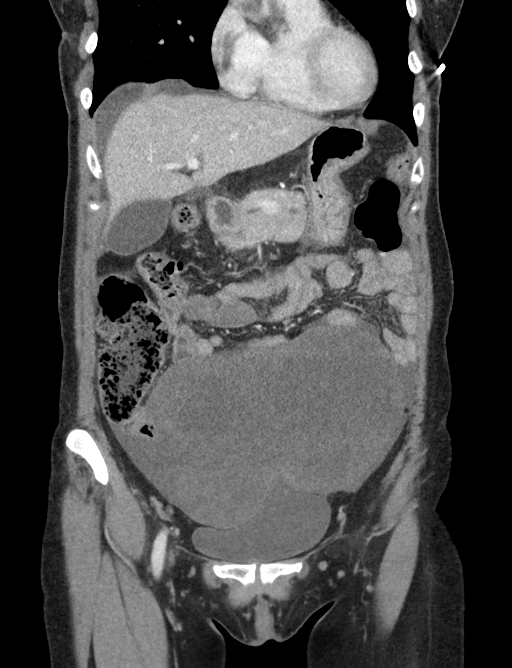
[im 53/119  soft-tissue]
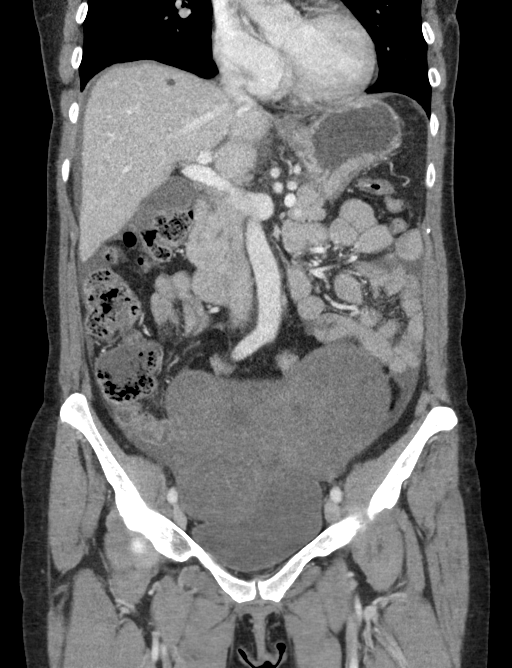
[im 66/119  soft-tissue]
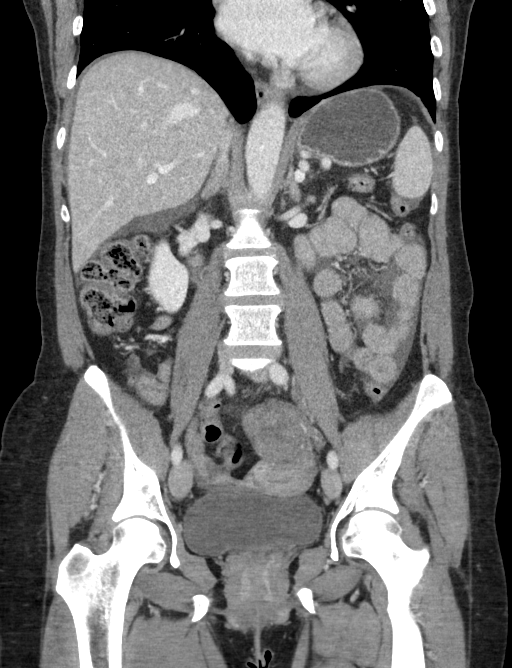

[16 of 46 positions shown; findings below may reference images not displayed]

FINDINGS: Lower chest: Normal

Hepatobiliary: Benign 9 mm cyst at the dome of the liver. No other
liver parenchymal finding. No calcified gallstones.

Pancreas: Normal

Spleen: Normal

Adrenals/Urinary Tract: Adrenal glands are normal. Right kidney is
normal. Left kidney is normal except for a 2.6 cm cyst. The bladder
is intrinsically normal.

Stomach/Bowel: No primary bowel finding.

Vascular/Lymphatic: Normal

Reproductive: Complex pelvic mass measuring 19 x 12 x 13 cm likely
of ovarian origin. Ascites, likely malignant. I do not see any
obvious distant nodular components. The uterus itself is normal.

Other: No free air.

Musculoskeletal: Normal
IMPRESSION: Very large pelvic mass, 19 x 12 x 13 cm approximately, consistent
with ovarian carcinoma. Likely malignant ascites.

No evidence of bowel pathology or other organ pathology.

## 2019-11-30 ENCOUNTER — Other Ambulatory Visit: Payer: Self-pay | Admitting: Family Medicine

## 2019-11-30 DIAGNOSIS — R109 Unspecified abdominal pain: Secondary | ICD-10-CM

## 2022-03-30 ENCOUNTER — Other Ambulatory Visit: Payer: Self-pay

## 2022-03-30 ENCOUNTER — Telehealth: Payer: Self-pay | Admitting: Gastroenterology

## 2022-03-30 MED ORDER — MESALAMINE 1000 MG RE SUPP: 1000.0000 mg | Freq: Every day | RECTAL | 0 refills | Status: DC

## 2022-03-30 NOTE — Telephone Encounter (Signed)
Patient has been made aware of suppositories that were sent to pharmacy & appointment that has been scheduled for tomorrow. Patient verbalized all understanding, no further questions.

## 2022-03-30 NOTE — Telephone Encounter (Signed)
She has a history of ulcerative proctitis. Schedule appt with me or an APP soon. Canasa 1,000 mg supp 1 PR HS, #30, no refills.

## 2022-03-30 NOTE — Telephone Encounter (Signed)
Left message for patient to call back. Canasa suppositories have been sent to pharmacy & f/u has been scheduled with Nevin Bloodgood, NP tomorrow 6/21 at 10:45 am.

## 2022-03-30 NOTE — Telephone Encounter (Signed)
Patient called in requesting for a refill on colitis medication. She has complaints of LLQ abdominal pain & rectal bleeding for the last two weeks. She is still able to tolerate food/liquids okay. She stated the last time she experienced this, Dr. [name] Plan sent in a medication for a colitis flare. Patient was last seen for colon in 07/2019. Will route to MD for further recommendations.

## 2022-03-31 ENCOUNTER — Other Ambulatory Visit (INDEPENDENT_AMBULATORY_CARE_PROVIDER_SITE_OTHER): Payer: No Typology Code available for payment source

## 2022-03-31 ENCOUNTER — Ambulatory Visit (INDEPENDENT_AMBULATORY_CARE_PROVIDER_SITE_OTHER): Payer: No Typology Code available for payment source | Admitting: Nurse Practitioner

## 2022-03-31 ENCOUNTER — Other Ambulatory Visit: Payer: Self-pay

## 2022-03-31 ENCOUNTER — Encounter: Payer: Self-pay | Admitting: Nurse Practitioner

## 2022-03-31 VITALS — BP 104/70 | HR 82 | Ht 65.0 in | Wt 145.0 lb

## 2022-03-31 DIAGNOSIS — K51218 Ulcerative (chronic) proctitis with other complication: Secondary | ICD-10-CM

## 2022-03-31 DIAGNOSIS — R197 Diarrhea, unspecified: Secondary | ICD-10-CM

## 2022-03-31 LAB — CBC WITH DIFFERENTIAL/PLATELET
Basophils Absolute: 0 10*3/uL (ref 0.0–0.1)
Basophils Relative: 1 % (ref 0.0–3.0)
Eosinophils Absolute: 0.2 10*3/uL (ref 0.0–0.7)
Eosinophils Relative: 4.6 % (ref 0.0–5.0)
HCT: 41.5 % (ref 36.0–46.0)
Hemoglobin: 13.7 g/dL (ref 12.0–15.0)
Lymphocytes Relative: 36.2 % (ref 12.0–46.0)
Lymphs Abs: 1.6 10*3/uL (ref 0.7–4.0)
MCHC: 33.1 g/dL (ref 30.0–36.0)
MCV: 96.1 fl (ref 78.0–100.0)
Monocytes Absolute: 0.3 10*3/uL (ref 0.1–1.0)
Monocytes Relative: 7.6 % (ref 3.0–12.0)
Neutro Abs: 2.2 10*3/uL (ref 1.4–7.7)
Neutrophils Relative %: 50.6 % (ref 43.0–77.0)
Platelets: 196 10*3/uL (ref 150.0–400.0)
RBC: 4.32 Mil/uL (ref 3.87–5.11)
RDW: 14 % (ref 11.5–15.5)
WBC: 4.3 10*3/uL (ref 4.0–10.5)

## 2022-03-31 LAB — SEDIMENTATION RATE: Sed Rate: 8 mm/hr (ref 0–30)

## 2022-03-31 LAB — C-REACTIVE PROTEIN: CRP: <1 mg/dL (ref 0.5–20.0)

## 2022-03-31 NOTE — Progress Notes (Signed)
  Assessment &  Plan    61 yo female with longstanding ulcerative proctitis. She hasn't required treatment in year and no active disease on last colonoscopy 3 years ago. Now with 3 weeks of loose, mucoid stools with some blood and LLQ pain. Rule out IBD flare, infectious etiology.  Stool to check for pathogens.  Fecal calprotectin ESR, CRP, CBC Start Canasa supp at night ( Rx called in yesterday) Will contact her with lab results , to get a condition update. If stools studies negative for infection and not improving then might add oral mesalamine + / - course of prednisone.  Follow up with me in 3-4 weeks.    HPI   Chief Complaint : bowel changes, blood in stool and abdominal pain.   Longstanding ulcerative colitis. She really hasn't had any symptoms in years and no active disease on last colonoscopy ~ 3 years ago.   Interval History:  Three weeks ago Jacqueline Castro started having loose, mucoid stool with blood. Having up to three BMs a day while her normal is generally one solid BM a day. Sometimes throughout the day she feels like she has to have a BM but just passes blood, mucous and gas. She has associated LLQ. The pain has no relationship to bowel movements but gets worse after eating. She has also been having intermittent nausea since onset of symptoms. Last antibiotic use was about 3 months.   Lametria has been having recurrent pain in joints of fingers. She has had this before and was apparently told it was arthritis. Started Meloxicam one week ago.    She has been under a lot of family stress. Lost mother in March.    Previous GI Evaluation   *Most recent colonscopy October 2020 Colonoscopy  One 7 mm polyp in the transverse colon, removed with a cold snare. Resected and retrieved. - The examination was otherwise normal on direct and retroflexion views.  Diagnosis 1. Surgical [P], right colon biopsies - BENIGN COLONIC MUCOSA. - NO SIGNIFICANT INFLAMMATION OR OTHER ABNORMALITIES  IDENTIFIED. 2. Surgical [P], left colon - BENIGN COLONIC MUCOSA. - NO SIGNIFICANT INFLAMMATION OR OTHER ABNORMALITIES IDENTIFIED. 3. Surgical [P], colon, transverse, polyp - - SESSILE SERRATED POLYP WITHOUT CYTOLOGIC DYSPLASIA. 4. Surgical [P], colon, rectosigmoid and rectum - BENIGN COLONIC MUCOSA. - NO SIGNIFICANT INFLAMMATION OR OTHER ABNORMALITIES IDENTIFIED  Past Medical History:  Diagnosis Date   Anemia    Anxiety    Chest pain    Palpitations    Proctitis    Ulcerative colitis     Past Surgical History:  Procedure Laterality Date   CESAREAN SECTION     LAPAROTOMY N/A 05/13/2018   Procedure: EXPLORATORY LAPAROTOMY, TOTAL ABDOMINAL HYSTERECTOMY, BILATERAL SALPINGO-OOPHORECTOMY, OMENTECTOMY;  Surgeon: Rossi, Emma, MD;  Location: [facility];  Service: Gynecology;  Laterality: N/A;   LUMBAR DISC SURGERY     Neck   TUBAL LIGATION      Current Medications, Allergies, Family History and Social History were reviewed in [facility] electronic medical record.     Current Outpatient Medications  Medication Sig Dispense Refill   ALPRAZolam (XANAX) 0.5 MG tablet TAKE UP TO 1 TABLET BY MOUTH AS NEEDED EVERY DAY FOR ANXIETY/PANIC     DULoxetine (CYMBALTA) 60 MG capsule Take 120 mg by mouth daily.     mesalamine (CANASA) 1000 MG suppository Place 1 suppository (1,000 mg total) rectally at bedtime. 30 suppository 0   traZODone (DESYREL) 50 MG tablet Take 100 mg by mouth at bedtime.       tretinoin (RETIN-A) 0.1 % cream APPLY TO AFFECTED AREA EVERY DAY AT NIGHT     No current facility-administered medications for this visit.    Review of Systems: No chest pain. No shortness of breath. No urinary complaints.    Physical Exam  Wt Readings from Last 3 Encounters:  03/31/22 145 lb (65.8 kg)  07/26/19 134 lb (60.8 kg)  07/03/19 134 lb 12.8 oz (61.1 kg)    BP 104/70   Pulse 82   Ht 5' 5" (1.651 m)   Wt 145 lb (65.8 kg)   LMP 08/29/2011   SpO2 98%   BMI 24.13 kg/m   Constitutional:  Generally well appearing female in no acute distress. Psychiatric: Pleasant. Normal mood and affect. Behavior is normal. EENT: Pupils normal.  Conjunctivae are normal. No scleral icterus. Neck supple.  Cardiovascular: Normal rate, regular rhythm. No edema Pulmonary/chest: Effort normal and breath sounds normal. No wheezing, rales or rhonchi. Abdominal: Soft, nondistended, mild RLQ tenderness. Bowel sounds active throughout. There are no masses palpable. No hepatomegaly. Neurological: Alert and oriented to person place and time. Skin: Skin is warm and dry. No rashes noted.   , NP  03/31/2022, 11:00 AM          

## 2022-03-31 NOTE — Progress Notes (Unsigned)
gi

## 2022-03-31 NOTE — Patient Instructions (Signed)
If you are age 63 or older, your body mass index should be between 23-30. Your Body mass index is 24.13 kg/m. If this is out of the aforementioned range listed, please consider follow up with your Primary Care Provider.  If you are age 34 or younger, your body mass index should be between 19-25. Your Body mass index is 24.13 kg/m. If this is out of the aformentioned range listed, please consider follow up with your Primary Care Provider.   ________________________________________________________  The [facility] providers would like to encourage you to use [facility] to communicate with providers for non-urgent requests or questions.  Due to long hold times on the telephone, sending your provider a message by [facility] may be a faster and more efficient way to get a response.  Please allow 48 business hours for a response.  Please remember that this is for non-urgent requests.  _______________________________________________________  Your provider has requested that you go to the basement level for lab work before leaving today. Press "B" on the elevator. The lab is located at the first door on the left as you exit the elevator.  It was a pleasure to see you today!  Thank you for trusting me with your gastrointestinal care!

## 2022-04-05 ENCOUNTER — Other Ambulatory Visit: Payer: Self-pay

## 2022-04-05 MED ORDER — MESALAMINE ER 0.375 G PO CP24: 1.5000 g | ORAL_CAPSULE | Freq: Every day | ORAL | 3 refills | Status: AC

## 2022-04-22 ENCOUNTER — Other Ambulatory Visit: Payer: Self-pay | Admitting: Gastroenterology

## 2022-04-28 ENCOUNTER — Telehealth: Payer: Self-pay

## 2022-04-28 ENCOUNTER — Other Ambulatory Visit: Payer: Self-pay

## 2022-04-28 ENCOUNTER — Other Ambulatory Visit: Payer: Self-pay | Admitting: Gastroenterology

## 2022-04-28 NOTE — Telephone Encounter (Signed)
Spoke with pt and pt stated she is doing much better. Pt wanted to know if she could cancel her appointment tomorrow since she feels better. Is it okay if I cancel tomorrows appt? Pt also stated she already had the apriso that was sent to her pharmacy in June and she does not need refills currently. Pt states she will start Apriso after she finishes the Canasa supp. Colon recall in for 07/2022.

## 2022-04-28 NOTE — Telephone Encounter (Signed)
-----   Message from Willia Craze, NP sent at 04/27/2022  3:36 PM EDT ----- Mickel Baas,   Please see how she is doing. Hopefully better on canasa supp. Let her know I did talk with Dr. [name] Plan after I saw her in clinic. He  recommends that she start an oral medication once done with the course of canasa supp.  Prefer Apriso 1.5 g qd or Lialda 2.4 g bid if they are covered. If she declines PO medications then she can go without maintenance treatment with a higher risk of recurrent flares. Please make sure she is on colonoscopy recall list for 07/2022.   Thanks, pg

## 2022-04-29 ENCOUNTER — Ambulatory Visit: Payer: No Typology Code available for payment source | Admitting: Nurse Practitioner

## 2022-09-12 ENCOUNTER — Emergency Department (HOSPITAL_BASED_OUTPATIENT_CLINIC_OR_DEPARTMENT_OTHER): Payer: No Typology Code available for payment source | Admitting: Radiology

## 2022-09-12 ENCOUNTER — Other Ambulatory Visit: Payer: Self-pay

## 2022-09-12 ENCOUNTER — Emergency Department (HOSPITAL_BASED_OUTPATIENT_CLINIC_OR_DEPARTMENT_OTHER)
Admission: EM | Admit: 2022-09-12 | Discharge: 2022-09-12 | Disposition: A | Discharge status: Home / Self Care | Payer: No Typology Code available for payment source | Attending: Emergency Medicine | Admitting: Emergency Medicine

## 2022-09-12 ENCOUNTER — Encounter (HOSPITAL_BASED_OUTPATIENT_CLINIC_OR_DEPARTMENT_OTHER): Payer: Self-pay

## 2022-09-12 DIAGNOSIS — F439 Reaction to severe stress, unspecified: Secondary | ICD-10-CM | POA: Diagnosis not present

## 2022-09-12 DIAGNOSIS — E860 Dehydration: Secondary | ICD-10-CM | POA: Insufficient documentation

## 2022-09-12 DIAGNOSIS — R079 Chest pain, unspecified: Secondary | ICD-10-CM

## 2022-09-12 DIAGNOSIS — F430 Acute stress reaction: Secondary | ICD-10-CM

## 2022-09-12 LAB — BASIC METABOLIC PANEL
Anion gap: 7 (ref 5–15)
BUN: 15 mg/dL (ref 8–23)
CO2: 26 mmol/L (ref 22–32)
Calcium: 9.5 mg/dL (ref 8.9–10.3)
Chloride: 101 mmol/L (ref 98–111)
Creatinine, Ser: 0.78 mg/dL (ref 0.44–1.00)
GFR, Estimated: >60 mL/min (ref >60)
Glucose, Bld: 115 mg/dL — ABNORMAL HIGH (ref 70–99)
Potassium: 4.3 mmol/L (ref 3.5–5.1)
Sodium: 134 mmol/L — ABNORMAL LOW (ref 135–145)

## 2022-09-12 LAB — CBC
HCT: 41.4 % (ref 36.0–46.0)
Hemoglobin: 14 g/dL (ref 12.0–15.0)
MCH: 32 pg (ref 26.0–34.0)
MCHC: 33.8 g/dL (ref 30.0–36.0)
MCV: 94.5 fL (ref 80.0–100.0)
Platelets: 229 10*3/uL (ref 150–400)
RBC: 4.38 MIL/uL (ref 3.87–5.11)
RDW: 12.8 % (ref 11.5–15.5)
WBC: 5.6 10*3/uL (ref 4.0–10.5)
nRBC: 0 % (ref 0.0–0.2)

## 2022-09-12 LAB — TROPONIN I (HIGH SENSITIVITY): Troponin I (High Sensitivity): 3 ng/L (ref <18)

## 2022-09-12 MED ORDER — LACTATED RINGERS IV BOLUS
1000.0000 mL | Freq: Once | INTRAVENOUS | Status: AC
  Administered 2022-09-12: 1000 mL via INTRAVENOUS

## 2022-09-12 MED ORDER — SODIUM CHLORIDE 0.9% FLUSH: 3.0000 mL | Freq: Once | INTRAVENOUS | Status: DC

## 2022-09-12 MED ORDER — ACETAMINOPHEN 325 MG PO TABS
650.0000 mg | ORAL_TABLET | Freq: Once | ORAL | Status: AC
  Administered 2022-09-12: 650 mg via ORAL
  Filled 2022-09-12: qty 2

## 2022-09-12 NOTE — Discharge Instructions (Signed)
All the blood work including the markers to your heart and EKG were normal today.  Your x-ray of your chest showed normal heart and lungs.  Suspect that the symptoms you are having today are related to all the stress you had from yesterday as well as being maybe a little bit dehydrated as well as a reaction to the medicine you had yesterday in the wine.  Continue to hydrate at home.  Return if you develop high fever, shortness of breath, persistent vomiting or other concerns.

## 2022-09-12 NOTE — ED Notes (Signed)
Blue top on hold in lab.

## 2022-09-12 NOTE — ED Provider Notes (Signed)
[facility] Provider Note   CSN: [id number] Arrival date & time: 09/12/22  1234     History  Chief Complaint  Patient presents with   Chest Pain    Jacqueline Castro is a 61 y.o. female.  Patient is a 61 year old female with a history of anxiety, ulcerative colitis who is presenting today with complaint of headache, chest pain and general fatigue.  Patient reports that yesterday was a very stressful day.  She was dealing with her daughter who has mental illness the police had to be involved and she had to take IVC paperwork out on her.  Last night she was so anxious and distraught from the day she took one of her Xanax that she has as needed and had 2 glasses of wine.  She does not report feeling particularly intoxicated but after she got home she was 9 on her bed and she slid off hitting her face on the nightstand.  Her husband reports that she was out for a few seconds.  She reports she is not sure how or why that happened but today she is just since waking up this morning had some pressure in her chest, a headache and been fatigued.  She denies any indigestion or symptoms worsening with exertion or food.  She has had an ongoing dry mouth and has been trying to drink fluids.  She denies any visual changes, cough, shortness of breath, nausea, vomiting or diarrhea.  She has not started any new medications.  She denies any recent flulike illness or family members who are ill.  She has no known heart disease.  The history is provided by the patient.  Chest Pain      Home Medications Prior to Admission medications   Medication Sig Start Date End Date Taking? Authorizing Provider  ALPRAZolam (XANAX) 0.5 MG tablet TAKE UP TO 1 TABLET BY MOUTH AS NEEDED EVERY DAY FOR ANXIETY/PANIC 12/05/18  Yes [provider]  DULoxetine (CYMBALTA) 60 MG capsule Take 120 mg by mouth daily.   Yes [provider]  traZODone (DESYREL) 50 MG tablet Take 100 mg by mouth  at bedtime.   Yes [provider]  tretinoin (RETIN-A) 0.1 % cream APPLY TO AFFECTED AREA EVERY DAY AT NIGHT 08/13/18  Yes [provider]  mesalamine (APRISO) 0.375 g 24 hr capsule Take 4 capsules (1.5 g total) by mouth daily. 04/05/22   Willia Craze, NP  mesalamine (CANASA) 1000 MG suppository PLACE 1 SUPPOSITORY (1,000 MG TOTAL) RECTALLY AT BEDTIME. 04/22/22   Ladene Artist, MD      Allergies    Codeine    Review of Systems   Review of Systems  Cardiovascular:  Positive for chest pain.    Physical Exam Updated Vital Signs BP (!) 149/96   Pulse 97   Temp 98.2 F (36.8 C) (Oral)   Resp 17   LMP 08/29/2011   SpO2 98%  Physical Exam Vitals and nursing note reviewed.  Constitutional:      General: She is not in acute distress.    Appearance: She is well-developed.  HENT:     Head: Normocephalic and atraumatic.     Comments: No significant facial bruising or swelling    Mouth/Throat:     Mouth: Mucous membranes are dry.  Eyes:     Pupils: Pupils are equal, round, and reactive to light.  Cardiovascular:     Rate and Rhythm: Normal rate and regular rhythm.  Heart sounds: Normal heart sounds. No murmur heard.    No friction rub.  Pulmonary:     Effort: Pulmonary effort is normal.     Breath sounds: Normal breath sounds. No wheezing or rales.  Abdominal:     General: Bowel sounds are normal. There is no distension.     Palpations: Abdomen is soft.     Tenderness: There is no abdominal tenderness. There is no guarding or rebound.  Musculoskeletal:        General: No tenderness. Normal range of motion.     Comments: No edema  Skin:    General: Skin is warm and dry.     Findings: No rash.  Neurological:     Mental Status: She is alert and oriented to person, place, and time. Mental status is at baseline.     Cranial Nerves: No cranial nerve deficit.  Psychiatric:        Behavior: Behavior normal.     ED Results / Procedures / Treatments    Labs (all labs ordered are listed, but only abnormal results are displayed) Labs Reviewed  BASIC METABOLIC PANEL - Abnormal; Notable for the following components:      Result Value   Sodium 134 (*)    Glucose, Bld 115 (*)    All other components within normal limits  CBC  TROPONIN I (HIGH SENSITIVITY)  TROPONIN I (HIGH SENSITIVITY)    EKG EKG Interpretation  Date/Time:  Sunday September 12 2022 13:33:43 EST Ventricular Rate:  94 PR Interval:  154 QRS Duration: 82 QT Interval:  354 QTC Calculation: 442 R Axis:   40 Text Interpretation: Normal sinus rhythm Normal ECG When compared with ECG of 16-May-2018 19:10, hr has decreased Confirmed by Pattricia Boss [phone]) on 09/12/2022 2:35:16 PM  Radiology DG Chest 2 View  Result Date: 09/12/2022 CLINICAL DATA:  Chest tightness and heaviness.  Syncope. EXAM: CHEST - 2 VIEW COMPARISON:  May 16, 2018 FINDINGS: A tortuous thoracic aorta is again identified. The heart, hila, mediastinum, lungs, and pleura are otherwise unremarkable. IMPRESSION: No active cardiopulmonary disease. Electronically Signed   By: Dorise Bullion III M.D.   On: 09/12/2022 14:24    Procedures Procedures    Medications Ordered in ED Medications  sodium chloride flush (NS) 0.9 % injection 3 mL (has no administration in time range)  acetaminophen (TYLENOL) tablet 650 mg (has no administration in time range)  lactated ringers bolus 1,000 mL (has no administration in time range)    ED Course/ Medical Decision Making/ A&P                           Medical Decision Making Amount and/or Complexity of Data Reviewed Labs: ordered. Decision-making details documented in ED Course. Radiology: ordered and independent interpretation performed. Decision-making details documented in ED Course. ECG/medicine tests: ordered and independent interpretation performed. Decision-making details documented in ED Course.  Risk OTC drugs.   Pt with multiple medical problems and  comorbidities and presenting today with a complaint that caries a high risk for morbidity and mortality.  Here today due to nonspecific chest pain, headache and dry mouth.  Suspect patient's symptoms are related to side effects of stress and medication she had yesterday.  She has not had of exertional symptoms, shortness of breath.  Low suspicion at this time for PE, dissection, ACS.  Possible GERD related to stress but she does not feel that it is worse with eating.  She has  no reproducible abdominal pain or neurologic findings at this time.  I independently interpreted patient's EKG and labs.  EKG is within normal limits, CBC, BMP and troponin are all within normal limits.  Troponin is now been taken greater then 8 hours after she woke up this morning with pain being persistent.  I have independently visualized and interpreted pt's images today.  Chest x-ray is within normal limits.  Feel the patient does have a mild degree of dehydration.  Will give IV fluids and Tylenol.  Otherwise findings discussed with the patient.  She would like to go home.  No indication for admission at this time.  Encouraged her to continue p.o. fluids at home.  Given return precautions.          Final Clinical Impression(s) / ED Diagnoses Final diagnoses:  Dehydration  Nonspecific chest pain  Stress reaction    Rx / DC Orders ED Discharge Orders     None         Blanchie Dessert, MD 09/12/22 1523

## 2022-09-12 NOTE — ED Triage Notes (Signed)
In for eval of heaviness and tightness in upper chest, fatigue, dry mouth, headache. Patient's daughter has bipolar and had severe episode yesterday. Took a xanax 0.5 po last pm and had a few glasses of wine. [place] out of bed last pm. Husband reported patient was pale and unconscious for a seconds. Struck lft side of face. Took BP because of feeling off, 184/100.

## 2022-10-26 ENCOUNTER — Encounter: Payer: Self-pay | Admitting: Gastroenterology

## 2023-01-06 ENCOUNTER — Encounter: Payer: Self-pay | Admitting: Gastroenterology

## 2023-01-31 ENCOUNTER — Encounter: Payer: Self-pay | Admitting: Gastroenterology

## 2023-01-31 ENCOUNTER — Ambulatory Visit (AMBULATORY_SURGERY_CENTER): Payer: No Typology Code available for payment source | Admitting: *Deleted

## 2023-01-31 VITALS — Ht 64.0 in | Wt 142.0 lb

## 2023-01-31 DIAGNOSIS — Z8719 Personal history of other diseases of the digestive system: Secondary | ICD-10-CM

## 2023-01-31 DIAGNOSIS — Z86010 Personal history of colonic polyps: Secondary | ICD-10-CM

## 2023-01-31 MED ORDER — NA SULFATE-K SULFATE-MG SULF 17.5-3.13-1.6 GM/177ML PO SOLN: 1.0000 | Freq: Once | ORAL | 0 refills | Status: AC

## 2023-01-31 NOTE — Progress Notes (Signed)
No egg or soy allergy known to patient  No issues known to pt with past sedation with any surgeries or procedures Patient denies ever being told they had issues or difficulty with intubation  No FH of Malignant Hyperthermia Pt is not on diet pills Pt is not on  home 02  Pt is not on blood thinners  Pt denies issues with constipation  No A fib or A flutter Have any cardiac testing pending--no Pt instructed to use Singlecare.com or GoodRx for a price reduction on prep  Patient's chart reviewed by Jacqueline Castro CNRA prior to previsit and patient appropriate for the [facility].  Previsit completed and red dot placed by patient's name on their procedure day (on provider's schedule).    

## 2023-02-28 ENCOUNTER — Ambulatory Visit: Payer: No Typology Code available for payment source | Admitting: Gastroenterology

## 2023-02-28 ENCOUNTER — Encounter: Payer: Self-pay | Admitting: Gastroenterology

## 2023-02-28 VITALS — BP 118/67 | HR 82 | Temp 98.4°F | Resp 18 | Ht 64.0 in | Wt 142.0 lb

## 2023-02-28 DIAGNOSIS — Z86010 Personal history of colonic polyps: Secondary | ICD-10-CM | POA: Diagnosis not present

## 2023-02-28 DIAGNOSIS — K51218 Ulcerative (chronic) proctitis with other complication: Secondary | ICD-10-CM | POA: Diagnosis not present

## 2023-02-28 DIAGNOSIS — Z8719 Personal history of other diseases of the digestive system: Secondary | ICD-10-CM

## 2023-02-28 DIAGNOSIS — Z09 Encounter for follow-up examination after completed treatment for conditions other than malignant neoplasm: Secondary | ICD-10-CM | POA: Diagnosis present

## 2023-02-28 MED ORDER — SODIUM CHLORIDE 0.9 % IV SOLN: 500.0000 mL | Freq: Once | INTRAVENOUS | Status: DC

## 2023-02-28 NOTE — Progress Notes (Signed)
To pacu, VSS. Report to [email or web address] 

## 2023-02-28 NOTE — Patient Instructions (Addendum)
Resume previous diet Continue present medications Await pathology results Handouts/ information given for polyps, diverticulosis and hemorrhoids  YOU HAD AN ENDOSCOPIC PROCEDURE TODAY AT THE [facility]:   Refer to the procedure report that was given to you for any specific questions about what was found during the examination.  If the procedure report does not answer your questions, please call your gastroenterologist to clarify.  If you requested that your care partner not be given the details of your procedure findings, then the procedure report has been included in a sealed envelope for you to review at your convenience later.  YOU SHOULD EXPECT: Some feelings of bloating in the abdomen. Passage of more gas than usual.  Walking can help get rid of the air that was put into your GI tract during the procedure and reduce the bloating. If you had a lower endoscopy (such as a colonoscopy or flexible sigmoidoscopy) you may notice spotting of blood in your stool or on the toilet paper. If you underwent a bowel prep for your procedure, you may not have a normal bowel movement for a few days.  Please Note:  You might notice some irritation and congestion in your nose or some drainage.  This is from the oxygen used during your procedure.  There is no need for concern and it should clear up in a day or so.  SYMPTOMS TO REPORT IMMEDIATELY:  Following lower endoscopy (colonoscopy or flexible sigmoidoscopy):  Excessive amounts of blood in the stool  Significant tenderness or worsening of abdominal pains  Swelling of the abdomen that is new, acute  Fever of 100F or higher  For urgent or emergent issues, a gastroenterologist can be reached at any hour by calling [phone]. Do not use MyChart messaging for urgent concerns.    DIET:  We do recommend a small meal at first, but then you may proceed to your regular diet.  Drink plenty of fluids but you should avoid alcoholic beverages for 24  hours.  ACTIVITY:  You should plan to take it easy for the rest of today and you should NOT DRIVE or use heavy machinery until tomorrow (because of the sedation medicines used during the test).    FOLLOW UP: Our staff will call the number listed on your records the next business day following your procedure.  We will call around 7:15- 8:00 am to check on you and address any questions or concerns that you may have regarding the information given to you following your procedure. If we do not reach you, we will leave a message.     If any biopsies were taken you will be contacted by phone or by letter within the next 1-3 weeks.  Please call us at [phone] if you have not heard about the biopsies in 3 weeks.    SIGNATURES/CONFIDENTIALITY: You and/or your care partner have signed paperwork which will be entered into your electronic medical record.  These signatures attest to the fact that that the information above on your After Visit Summary has been reviewed and is understood.  Full responsibility of the confidentiality of this discharge information lies with you and/or your care-partner. 

## 2023-02-28 NOTE — Op Note (Signed)
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 The patient's tolerance of                            previous anesthesia was also reviewed. The risks                            and benefits of the procedure and the sedation                            options and risks were discussed with the patient.                            All questions were answered, and informed consent                            was obtained. Prior Anticoagulants: The patient has                            taken no anticoagulant or antiplatelet agents. ASA                            Grade Assessment: II - A patient with mild systemic  disease. After reviewing the risks and benefits,                            the patient was deemed in satisfactory condition to                            undergo the procedure.                           After obtaining informed consent, the colonoscope                            was passed under direct vision. Throughout the                            procedure, the patient's blood pressure,  pulse, and                            oxygen saturations were monitored continuously. The                            CF [id number] #[id number] was introduced through the anus                            and advanced to the the cecum, identified by                            appendiceal orifice and ileocecal valve. The                            ileocecal valve, appendiceal orifice, and rectum                            were photographed. The quality of the bowel                            preparation was good. The colonoscopy was performed                            without difficulty. The patient tolerated the                            procedure well. Scope In: 8:04:31 AM Scope Out: 8:24:58 AM Scope Withdrawal Time: 0 hours 15 minutes 18 seconds  Total Procedure Duration: 0 hours 20 minutes 27 seconds  Findings:                 The perianal and digital rectal examinations were                            normal.                           A few large-mouthed, medium-mouthed and  small-mouthed diverticula were found in the sigmoid                            colon, descending colon and transverse colon. There                            was no evidence of diverticular bleeding.                           The exam was otherwise without abnormality on                            direct and retroflexion views. Random biopsies                            obtained in the colon and rectum. Complications:            No immediate complications. Estimated blood loss:                            None. Estimated Blood Loss:     Estimated blood loss: none. Impression:               - Mild diverticulosis in the sigmoid colon, in the                            descending colon and in the transverse colon.                           - The examination was otherwise normal on direct                            and retroflexion views. Random biopsies obtained in                            the  colon and rectum. Recommendation:           - Repeat colonoscopy after studies are complete for                            surveillance based on pathology results.                           - Patient has a contact number available for                            emergencies. The signs and symptoms of potential                            delayed complications were discussed with the                            patient. Return to normal activities tomorrow.  Written discharge instructions were provided to the                            patient.                           - Resume previous diet.                           - Continue present medications.                           - Await pathology results. Meryl Dare, MD 02/28/2023 8:34:13 AM This report has been signed electronically.

## 2023-02-28 NOTE — Progress Notes (Signed)
Called to room to assist during endoscopic procedure.  Patient ID and intended procedure confirmed with present staff. Received instructions for my participation in the procedure from the performing physician.  

## 2023-02-28 NOTE — Progress Notes (Signed)
History & Physical  Primary Care Physician:  Merri Brunette, MD Primary Gastroenterologist: Claudette Head, MD  Impression / Plan:  History of ulcerative proctitis, personal history of a sessile serrated polyp for surveillance colonoscopy.  CHIEF COMPLAINT: History of ulcerative proctitis, Personal history of colon polyps   HPI: Jacqueline Castro is a 62 y.o. female with a history of ulcerative proctitis, personal history of a sessile serrated polyp for surveillance colonoscopy.   Past Medical History:  Diagnosis Date   Allergy    Anemia    Anxiety    Arthritis    Blood transfusion without reported diagnosis    Chest pain    GERD (gastroesophageal reflux disease)    Palpitations    Proctitis    Ulcerative colitis     Past Surgical History:  Procedure Laterality Date   CESAREAN SECTION     LAPAROTOMY N/A 05/13/2018   Procedure: EXPLORATORY LAPAROTOMY, TOTAL ABDOMINAL HYSTERECTOMY, BILATERAL SALPINGO-OOPHORECTOMY, OMENTECTOMY;  Surgeon: Adolphus Birchwood, MD;  Location: [facility] OR;  Service: Gynecology;  Laterality: N/A;   LUMBAR DISC SURGERY     Neck   TOTAL ABDOMINAL HYSTERECTOMY     TUBAL LIGATION      Prior to Admission medications   Medication Sig Start Date End Date Taking? Authorizing Provider  methocarbamol (ROBAXIN) 500 MG tablet Take 500 mg by mouth every 6 (six) hours as needed. 02/10/23  Yes [provider]  sertraline (ZOLOFT) 100 MG tablet Take by mouth. 01/25/23  Yes [provider]  traMADol HCl 100 MG TABS Take 1 tablet by mouth every 6 (six) hours as needed. 02/22/23  Yes [provider]  traZODone (DESYREL) 150 MG tablet Take 150 mg by mouth at bedtime.   Yes [provider]  tretinoin (RETIN-A) 0.1 % cream APPLY TO AFFECTED AREA EVERY DAY AT NIGHT 08/13/18  Yes [provider]  ALPRAZolam (XANAX) 0.5 MG tablet TAKE UP TO 1 TABLET BY MOUTH AS NEEDED EVERY DAY FOR ANXIETY/PANIC 12/05/18   [provider]  meloxicam  (MOBIC) 15 MG tablet Take 15 mg by mouth daily as needed. 02/10/23   [provider]  mesalamine (APRISO) 0.375 g 24 hr capsule Take 4 capsules (1.5 g total) by mouth daily. Patient not taking: Reported on 01/31/2023 04/05/22   Meredith Pel, NP    Current Outpatient Medications  Medication Sig Dispense Refill   methocarbamol (ROBAXIN) 500 MG tablet Take 500 mg by mouth every 6 (six) hours as needed.     sertraline (ZOLOFT) 100 MG tablet Take by mouth.     traMADol HCl 100 MG TABS Take 1 tablet by mouth every 6 (six) hours as needed.     traZODone (DESYREL) 150 MG tablet Take 150 mg by mouth at bedtime.     tretinoin (RETIN-A) 0.1 % cream APPLY TO AFFECTED AREA EVERY DAY AT NIGHT     ALPRAZolam (XANAX) 0.5 MG tablet TAKE UP TO 1 TABLET BY MOUTH AS NEEDED EVERY DAY FOR ANXIETY/PANIC     meloxicam (MOBIC) 15 MG tablet Take 15 mg by mouth daily as needed.     mesalamine (APRISO) 0.375 g 24 hr capsule Take 4 capsules (1.5 g total) by mouth daily. (Patient not taking: Reported on 01/31/2023) 360 capsule 3   Current Facility-Administered Medications  Medication Dose Route Frequency Provider Last Rate Last Admin   0.9 %  sodium chloride infusion  500 mL Intravenous Once Meryl Dare, MD        Allergies as of  02/28/2023 - Review Complete 02/28/2023  Allergen Reaction Noted   Codeine Anaphylaxis, Nausea And Vomiting, Nausea Only, and Other (See Comments) 04/15/2010   Other  02/28/2023    Family History  Problem Relation Age of Onset   Colon polyps Mother    Diabetes Mother    Heart Problems Sister        bicuspid aortic valve   Colon cancer Paternal Uncle 51   Colon cancer Paternal Grandfather 36   Stomach cancer Neg Hx    Esophageal cancer Neg Hx    Pancreatic cancer Neg Hx    Crohn's disease Neg Hx    Rectal cancer Neg Hx    Ulcerative colitis Neg Hx     Social History   Socioeconomic History   Marital status: Married    Spouse name: Not on file   Number of  children: 4   Years of education: Not on file   Highest education level: Not on file  Occupational History   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  Tobacco Use   Smoking status: Never   Smokeless tobacco: Never  Vaping Use   Vaping Use: Never used  Substance and Sexual Activity   Alcohol use: Yes    Alcohol/week: 1.0 - 2.0 standard drink of alcohol    Types: 1 - 2 Standard drinks or equivalent per week    Comment: occasionally   Drug use: No   Sexual activity: Not on file  Other Topics Concern   Not on file  Social History Narrative   Not on file   Social Determinants of Health   Financial Resource Strain: Not on file  Food Insecurity: Not on file  Transportation Needs: Not on file  Physical Activity: Not on file  Stress: Not on file  Social Connections: Not on file  Intimate Partner Violence: Not on file    Review of Systems:  All systems reviewed were negative except where noted in HPI.   Physical Exam:  General:  Alert, well-developed, in NAD Head:  Normocephalic and atraumatic. Eyes:  Sclera clear, no icterus.   Conjunctiva pink. Ears:  Normal auditory acuity. Mouth:  No deformity or lesions.  Neck:  Supple; no masses. Lungs:  Clear throughout to auscultation.   No wheezes, crackles, or rhonchi.  Heart:  Regular rate and rhythm; no murmurs. Abdomen:  Soft, nondistended, nontender. No masses, hepatomegaly. No palpable masses.  Normal bowel sounds.    Rectal:  Deferred   Msk:  Symmetrical without gross deformities. Extremities:  Without edema. Neurologic:  Alert and  oriented x 4; grossly normal neurologically. Skin:  Intact without significant lesions or rashes. Psych:  Alert and cooperative. Normal mood and affect.   [name]. Russella Dar  02/28/2023, 7:58 AM See Loretha Stapler, [facility], to contact our on call provider

## 2023-02-28 NOTE — Progress Notes (Signed)
Pt's states no medical or surgical changes since previsit or office visit. 

## 2023-03-01 ENCOUNTER — Telehealth: Payer: Self-pay | Admitting: *Deleted

## 2023-03-01 NOTE — Telephone Encounter (Signed)
Attempted to call patient for their post-procedure follow-up call. No answer. Left voicemail.   

## 2023-03-16 ENCOUNTER — Encounter: Payer: Self-pay | Admitting: Gastroenterology

## 2023-05-05 ENCOUNTER — Ambulatory Visit: Payer: No Typology Code available for payment source | Admitting: Physician Assistant

## 2023-06-10 ENCOUNTER — Ambulatory Visit: Payer: No Typology Code available for payment source | Admitting: Physician Assistant
